# Patient Record
Sex: Female | Born: 1935 | Race: White | Hispanic: No
Health system: Midwestern US, Academic
[De-identification: ages and names within clinical notes are randomized; demographics above are authoritative.]

---

## 2017-05-28 ENCOUNTER — Encounter: Admit: 2017-05-28 | Discharge: 2017-05-28 | Payer: MEDICARE

## 2017-06-28 ENCOUNTER — Encounter: Admit: 2017-06-28 | Discharge: 2017-06-28 | Payer: MEDICARE

## 2017-06-28 NOTE — Telephone Encounter
-----   Message from Weston BrassStephen Raymonde Hamblin sent at 05/25/2017  4:52 PM CDT -----  Regarding: bp check  Patient comes in for bp check after increase in metoprolol.  She brings her bp log and her bp machine.  Pressure at home appear to be high running mostly in the upper 150's-170's/80's-90's with pulse in the 80's.  In clinic today bp machine ran higher then manuel BP with 20pt difference diastolic.    Would you like to go up on metoprolol or add losartan with bp check in 2 wks

## 2017-06-28 NOTE — Telephone Encounter
Discussed with TLR contiue current coarse

## 2017-08-10 ENCOUNTER — Ambulatory Visit: Admit: 2017-08-10 | Discharge: 2017-08-11 | Payer: MEDICARE

## 2017-08-10 ENCOUNTER — Encounter: Admit: 2017-08-10 | Discharge: 2017-08-10 | Payer: MEDICARE

## 2017-08-10 DIAGNOSIS — E785 Hyperlipidemia, unspecified: ICD-10-CM

## 2017-08-10 DIAGNOSIS — I2510 Atherosclerotic heart disease of native coronary artery without angina pectoris: Principal | ICD-10-CM

## 2017-08-10 DIAGNOSIS — I10 Essential (primary) hypertension: ICD-10-CM

## 2017-08-10 DIAGNOSIS — E119 Type 2 diabetes mellitus without complications: ICD-10-CM

## 2017-08-10 DIAGNOSIS — I779 Disorder of arteries and arterioles, unspecified: ICD-10-CM

## 2017-08-10 DIAGNOSIS — Z951 Presence of aortocoronary bypass graft: ICD-10-CM

## 2017-08-10 DIAGNOSIS — I739 Peripheral vascular disease, unspecified: ICD-10-CM

## 2017-09-28 ENCOUNTER — Encounter: Admit: 2017-09-28 | Discharge: 2017-09-28 | Payer: MEDICARE

## 2017-09-28 ENCOUNTER — Ambulatory Visit: Admit: 2017-09-28 | Discharge: 2017-09-29 | Payer: MEDICARE

## 2017-09-28 DIAGNOSIS — E785 Hyperlipidemia, unspecified: ICD-10-CM

## 2017-09-28 DIAGNOSIS — I10 Essential (primary) hypertension: ICD-10-CM

## 2017-09-28 DIAGNOSIS — R0600 Dyspnea, unspecified: ICD-10-CM

## 2017-09-28 DIAGNOSIS — R000 Tachycardia, unspecified: ICD-10-CM

## 2017-09-28 DIAGNOSIS — I739 Peripheral vascular disease, unspecified: ICD-10-CM

## 2017-09-28 DIAGNOSIS — E782 Mixed hyperlipidemia: ICD-10-CM

## 2017-09-28 DIAGNOSIS — Z951 Presence of aortocoronary bypass graft: ICD-10-CM

## 2017-09-28 DIAGNOSIS — I2510 Atherosclerotic heart disease of native coronary artery without angina pectoris: Principal | ICD-10-CM

## 2017-09-28 DIAGNOSIS — E119 Type 2 diabetes mellitus without complications: ICD-10-CM

## 2017-09-28 DIAGNOSIS — I779 Disorder of arteries and arterioles, unspecified: ICD-10-CM

## 2017-09-28 NOTE — Telephone Encounter
Patient called today c/o increasing dyspnea over the past 2wks.  She denies fever.  She states occasional.  She requested work in for evaluation in clinic today.  Reports BP 156/82 P107.

## 2017-09-29 ENCOUNTER — Inpatient Hospital Stay
Admit: 2017-09-29 | Discharge: 2017-10-04 | Disposition: A | Discharge status: Home / Self Care | Payer: MEDICARE | Source: Other Acute Inpatient Hospital | Attending: Cardiovascular Disease | Admitting: Cardiovascular Disease

## 2017-09-29 ENCOUNTER — Encounter: Admit: 2017-09-29 | Discharge: 2017-09-29 | Payer: MEDICARE

## 2017-09-29 DIAGNOSIS — R69 Illness, unspecified: Principal | ICD-10-CM

## 2017-09-29 LAB — CBC AND DIFF
Lab: 0.1 10*3/uL (ref 0–0.20)
Lab: 0.1 10*3/uL (ref 0–0.45)
Lab: 0.8 10*3/uL (ref 0–0.80)
Lab: 1 % (ref 0–2)
Lab: 1 % (ref 0–5)
Lab: 1.9 10*3/uL (ref 1.0–4.8)
Lab: 12 10*3/uL — ABNORMAL HIGH (ref 4.5–11.0)
Lab: 6 % (ref 4–12)
Lab: 9.2 10*3/uL — ABNORMAL HIGH (ref 1.8–7.0)

## 2017-09-29 LAB — BASIC METABOLIC PANEL
Lab: 0.8 mg/dL (ref 0.4–1.00)
Lab: 134 MMOL/L — ABNORMAL LOW (ref 137–147)
Lab: 15 mg/dL — ABNORMAL HIGH (ref 7–25)
Lab: 167 mg/dL — ABNORMAL HIGH (ref 70–100)
Lab: 9.9 mg/dL (ref 8.5–10.6)
Lab: >60 mL/min (ref >60)
Lab: >60 mL/min — ABNORMAL LOW (ref >60)

## 2017-09-29 LAB — LACTIC ACID(LACTATE)
Lab: 2.6 MMOL/L — ABNORMAL HIGH (ref >60)
Lab: 1.2 MMOL/L (ref 0.5–2.0)

## 2017-09-29 LAB — BNP (B-TYPE NATRIURETIC PEPTI): Lab: 111 pg/mL — ABNORMAL HIGH (ref >60)

## 2017-09-29 LAB — LIVER FUNCTION PANEL
Lab: 0.1 mg/dL (ref <0.4)
Lab: 0.3 mg/dL (ref 0.3–1.2)
Lab: 15 U/L (ref 7–56)
Lab: 17 U/L (ref 7–40)
Lab: 3.6 g/dL (ref 3.5–5.0)
Lab: 48 U/L (ref 25–110)
Lab: 6.5 g/dL (ref 6.0–8.0)

## 2017-09-29 LAB — TROPONIN-I
Lab: 0.3 ng/mL — ABNORMAL HIGH (ref 0.0–0.05)
Lab: 0.2 ng/mL — ABNORMAL HIGH (ref 0.0–0.05)

## 2017-09-29 LAB — POC GLUCOSE
Lab: 137 mg/dL — ABNORMAL HIGH (ref 70–100)
Lab: 159 mg/dL — ABNORMAL HIGH (ref 70–100)
Lab: 165 mg/dL — ABNORMAL HIGH (ref 70–100)

## 2017-09-29 LAB — MAGNESIUM: Lab: 1.5 mg/dL — ABNORMAL LOW (ref 1.6–2.6)

## 2017-09-29 LAB — PTT (APTT): Lab: 29 s (ref 20.0–36.0)

## 2017-09-29 MED ORDER — PERFLUTREN LIPID MICROSPHERES 1.1 MG/ML IV SUSP
1-20 mL | Freq: Once | INTRAVENOUS | 0 refills | Status: CP
Start: 2017-09-29 — End: ?
  Administered 2017-09-29: 16:00:00 2 mL via INTRAVENOUS

## 2017-09-29 MED ORDER — ASPIRIN 81 MG PO TBEC
81 mg | Freq: Every day | ORAL | 0 refills | Status: DC
Start: 2017-09-29 — End: 2017-10-04
  Administered 2017-09-29 – 2017-10-04 (×6): 81 mg via ORAL

## 2017-09-29 MED ORDER — HEPARIN (PORCINE) BOLUS FOR CONTINUOUS INF (BAG)
20-40 [IU]/kg | INTRAVENOUS | 0 refills | Status: DC
Start: 2017-09-29 — End: 2017-10-02

## 2017-09-29 MED ORDER — METHYLPREDNISOLONE 16 MG PO TAB
32 mg | Freq: Once | ORAL | 0 refills | Status: CP | PRN
Start: 2017-09-29 — End: ?
  Administered 2017-09-29: 23:00:00 32 mg via ORAL

## 2017-09-29 MED ORDER — HEPARIN (PORCINE) BOLUS FOR CONTINUOUS INF (BAG)
80 [IU]/kg | Freq: Once | INTRAVENOUS | 0 refills | Status: CP
Start: 2017-09-29 — End: ?

## 2017-09-29 MED ORDER — INSULIN ASPART 100 UNIT/ML SC FLEXPEN
0-7 [IU] | Freq: Three times a day (TID) | SUBCUTANEOUS | 0 refills | Status: DC
Start: 2017-09-29 — End: 2017-09-30
  Administered 2017-09-29: 18:00:00 1 [IU] via SUBCUTANEOUS

## 2017-09-29 MED ORDER — AMLODIPINE 10 MG PO TAB
10 mg | Freq: Every day | ORAL | 0 refills | Status: DC
Start: 2017-09-29 — End: 2017-10-04
  Administered 2017-09-29 – 2017-10-04 (×6): 10 mg via ORAL

## 2017-09-29 MED ORDER — HYDRALAZINE 20 MG/ML IJ SOLN
10 mg | Freq: Once | INTRAVENOUS | 0 refills | Status: CP
Start: 2017-09-29 — End: ?
  Administered 2017-09-29: 16:00:00 10 mg via INTRAVENOUS

## 2017-09-29 MED ORDER — ROSUVASTATIN 20 MG PO TAB
20 mg | Freq: Every day | ORAL | 0 refills | Status: DC
Start: 2017-09-29 — End: 2017-10-04
  Administered 2017-09-29 – 2017-10-04 (×6): 20 mg via ORAL

## 2017-09-29 MED ORDER — LEVOTHYROXINE 100 MCG PO TAB
100 ug | Freq: Every day | ORAL | 0 refills | Status: DC
Start: 2017-09-29 — End: 2017-10-04
  Administered 2017-09-29 – 2017-10-04 (×8): 100 ug via ORAL

## 2017-09-29 MED ORDER — INSULIN ASPART 100 UNIT/ML SC FLEXPEN
0-7 [IU] | Freq: Before meals | SUBCUTANEOUS | 0 refills | Status: DC
Start: 2017-09-29 — End: 2017-10-02

## 2017-09-29 MED ORDER — HEPARIN (PORCINE) IN 5 % DEX 20,000 UNIT/500 ML (40 UNIT/ML) IV SOLP
0-2000 [IU]/h | INTRAVENOUS | 0 refills | Status: DC
Start: 2017-09-29 — End: 2017-10-01
  Administered 2017-09-29: 18:00:00 1445 [IU]/h via INTRAVENOUS
  Administered 2017-09-30 (×2): 1245 [IU]/h via INTRAVENOUS

## 2017-09-29 MED ORDER — NITROGLYCERIN 0.4 MG SL SUBL
0.4 mg | SUBLINGUAL | 0 refills | Status: DC | PRN
Start: 2017-09-29 — End: 2017-10-04

## 2017-09-29 MED ORDER — METOPROLOL TARTRATE 25 MG PO TAB
37.5 mg | Freq: Two times a day (BID) | ORAL | 0 refills | Status: DC
Start: 2017-09-29 — End: 2017-10-03
  Administered 2017-09-30 – 2017-10-03 (×8): 37.5 mg via ORAL

## 2017-09-29 MED ORDER — METHYLPREDNISOLONE 16 MG PO TAB
32 mg | Freq: Once | ORAL | 0 refills | Status: CP | PRN
Start: 2017-09-29 — End: ?
  Administered 2017-09-30: 09:00:00 32 mg via ORAL

## 2017-09-29 MED ORDER — DIPHENHYDRAMINE HCL 50 MG PO CAP
50 mg | Freq: Once | ORAL | 0 refills | Status: CP | PRN
Start: 2017-09-29 — End: ?
  Administered 2017-09-30: 10:00:00 50 mg via ORAL

## 2017-09-29 MED ORDER — ESCITALOPRAM OXALATE 5 MG PO TAB
5 mg | Freq: Every day | ORAL | 0 refills | Status: DC
Start: 2017-09-29 — End: 2017-10-04
  Administered 2017-09-29 – 2017-10-04 (×6): 5 mg via ORAL

## 2017-09-29 MED ORDER — SENNOSIDES-DOCUSATE SODIUM 8.6-50 MG PO TAB
2 | Freq: Two times a day (BID) | ORAL | 0 refills | Status: DC
Start: 2017-09-29 — End: 2017-10-04
  Administered 2017-09-29: 18:00:00 2 via ORAL
  Administered 2017-10-01: 02:00:00 1 via ORAL
  Administered 2017-10-02: 03:00:00 2 via ORAL
  Administered 2017-10-02 – 2017-10-03 (×2): 1 via ORAL
  Administered 2017-10-03: 01:00:00 2 via ORAL
  Administered 2017-10-04 (×2): 1 via ORAL

## 2017-09-29 MED ORDER — LORATADINE 10 MG PO TAB
10 mg | Freq: Every morning | ORAL | 0 refills | Status: DC
Start: 2017-09-29 — End: 2017-10-04
  Administered 2017-09-29 – 2017-10-04 (×6): 10 mg via ORAL

## 2017-09-29 MED ORDER — POTASSIUM CHLORIDE 20 MEQ PO TBTQ
60 meq | Freq: Once | ORAL | 0 refills | Status: CP
Start: 2017-09-29 — End: ?
  Administered 2017-09-29: 20:00:00 60 meq via ORAL

## 2017-09-29 MED ORDER — ACETAMINOPHEN 325 MG PO TAB
650 mg | ORAL | 0 refills | Status: DC | PRN
Start: 2017-09-29 — End: 2017-10-04

## 2017-09-29 MED ORDER — MAGNESIUM SULFATE IN D5W 1 GRAM/100 ML IV PGBK
1 g | INTRAVENOUS | 0 refills | Status: CP
Start: 2017-09-29 — End: ?
  Administered 2017-09-29 (×4): 1 g via INTRAVENOUS

## 2017-09-29 MED ORDER — ONDANSETRON HCL 4 MG PO TAB
4-8 mg | ORAL | 0 refills | Status: DC | PRN
Start: 2017-09-29 — End: 2017-10-02

## 2017-09-29 NOTE — Progress Notes
RT Adult Assessment Note    NAME:Deem ALORIA LOOPER             MRN: [id number]             DOB:[birth year]          AGE: 81 y.o.  ADMISSION DATE: 09/29/2017             DAYS ADMITTED: LOS: 0 days    RT Treatment Plan:       Protocol Plan: Procedures  Oxygen/Humidity: O2 to keep SpO2 > 92%  Monitoring: Pulse oximetry BID & PRN    Additional Comments:  Impressions of the patient: Patient laying in bed, alert, orientated on 4 lpm oxygen  Intervention(s)/outcome(s): monitor oxygen BID  Patient education that was completed: none  Recommendations to the care team: none    Vital Signs:  Pulse: Pulse: 99  RR: Respirations: (!) 27 PER MINUTE  SpO2: SpO2: 99 %  O2 Device: $$ O2 Device: Cannula  Liter Flow: O2 Liter Flow: 4 lpm  O2%:    Breath Sounds:    Respiratory Effort:

## 2017-09-29 NOTE — Consults
Katrina Wang  Admission Date: 09/29/2017  LOS: 0 days                     ASSESSMENT/PLAN     ATTESTATION    I have seen, personally fully evaluated, and discussed patient with the [facility] ICU team.  The patient is critically ill with Syncope. I spent 61 minutes (excluding time spent performing or supervising any procedures) providing and personally directing critical care services including direct OR recovery, ventilator management, hemodynamic monitoring and management, lab and radiology review, medication review and management, fluid and electrolyte management and coordination of care.    Staff name:  Cathlean Marseilles, MD Date:  09/29/2017  Time: 5:20 PM       Patient Active Problem List    Diagnosis Date Noted   ??? Syncope 09/29/2017   ??? Atherosclerosis of native artery of extremity with intermittent claudication (HCC) 09/29/2017   ??? Occlusion of carotid artery 09/29/2017   ??? Depression, controlled 09/29/2017   ??? 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 Sciatica of left side 09/29/2017   ??? Renal artery stenosis (HCC) 09/29/2017   ??? Dyspnea on exertion 09/28/2017   ??? Sinus tachycardia 09/28/2017   ??? Dyspnea 09/28/2017   ??? S/P CABG (coronary artery bypass graft) 11/25/2016   ??? Post-operative nausea and vomiting 10/23/2016   ??? Type 2 diabetes mellitus (HCC)    ??? Chronic diastolic CHF (congestive heart failure) (HCC) 10/16/2016   ??? H/O vein stripping 10/15/2016   ??? Hypertensive emergency 08/26/2016   ??? Near syncope 08/26/2016   ??? AKI (acute kidney injury) (HCC) 03/07/2012   ??? NSTEMI (non-ST elevated myocardial infarction) (HCC) 03/05/2012   ??? Acute respiratory failure with hypoxia (HCC) 03/05/2012   ??? CAD (coronary artery disease) 07/04/2008     1999: Complex stent PCI to proximal OMB    03/07/12: EF 60%, 20% ostial left main, 40% prox LAD, 70% mid, 40% distal LAD at previous POBA site; Dominant CFX - 20% prox-CFX, 40% mid-CFX, 30% prox 1st OMB,  50% instent restenosis in 2nd OMB, 40-50% distal 2nd OMB, 40-50% left PDA, small diffusely disease RCA without significant obstruction, per cath by Dr. Vivianne Spence.  No PCI indicated.     ??? Abnormal cardiovascular stress test 07/04/2008   ??? PVD (peripheral vascular disease) (HCC) 07/04/2008     Hx bilateral iliac stents     ??? Carotid artery disease (HCC) 07/04/2008     Hx left carotid endarterectomy     ??? HLD (hyperlipidemia) 07/04/2008   ??? HTN (hypertension) 07/04/2008        Neuro: Syncope, Depression, Chronic Pain  PRN pain medication. Concern for PE as etiology for syncope, workup in progress, TTE supports this. Assess daily for delirium. Continue home Lexapro. Monitor fos s/sx CVA with concern for PE.    Cardiac: Concern for PE with syncope and dyspnea, HTN, HLD, hx NSTEMI, CAD s/p PCI (OMB stent 1999) s/p CABG x 2 09/2016, Carotid artery stenosis s/p L CEA, severe PVD s/p b/l iliofemoral bypass, chronic HFpEF (60%)  Stronly suspect PE with evidence of RV strain and elevated PAP on TTE. Continue home ASA, metoprolol, statin. Heparin gtt for PE. Pt has contrast allergy and premedicating for CTA Chest. Pretreat with benadryl, methylpred. Troponins with modest peak at 0.30 --> 0.26, BNP 1100, LA 2.6 --> 1.2. Continue to trend. EKG with no acute S1Q3T3 concerns, or changes. BLE venous dopplers pending.  Respiratory:  Concern for PE, progressive dyspnea, acute hypoxic respiratory failure, dyspnea on exertion  Tolerating 4L O2 via NAC since admission for SpO2 > 92%. May need to send to CTA sooner if oxygen demands increase.  GI: No acute concerns  Continue bowel regimen, anti-emetics. NPO for procedures as needed. Cardiac diet. Check LFTs.  Heme: Concern for PE, need for anticoagulation/antiplatelet  Heparin gtt started. Continue home ASA at lower dose. No significant derangements in hematopoetic cell lines.   ID: Hx uterine ca s/p hysterectomy, leukocytosis Mild leukocytosis, no left shift. Likely reactive. Concern for etiology of possible PE with cancer hx. Pt also has significant activity restrictions 2/2 comorbidities.  Renal: Renal artery stenosis, DM2, hx AKI      Pt with chronic severe hypertension. MRI 07/2016 showed bilateral renal stenosis (moderate on right and moderate to severe on left). Also concerns for stenosis of major aortic trunks - difficult to visualize. May discuss with CV1 and add renal artery duplex +/- aortic runoff to CTA. Cr at baseline (0.87)  FEN: Hypomagnesemia, DM2  Electrolyte goals while in ICU:  Mg >2.0, iCal > 1.0, K+ >4.0 mEq/L.  Insulin per Modified Yale Protocol as needed. Check TSH - elevated in 10/2016. Continue home levothyroxine.  Activity: As tolerated    Dispo:  Requires ICU for treatment of possible PE, evaluate syncope, mild respiratory insuffiency    Lines:  PIV x 2  Urinary Catheter:  No  ABX: no recent antibiotics  DVT PPx: Heparin  GI PPx: none needed - tolerating cardiac diet PO    __________________________________________________________________________________  HISTORY     HPI  35F for transfer from OSH with hx of NSTEMI, CAD s/p PCI and CABG 09/2016, DM2, PVD s/p bilateral ilio-fem bypass, HTN, HLD, hypothyroidism, carotid disease and uterine cancer. She presented to OSH ED after syncopal episode and c/o 1 month progressive dyspnea. TTE 10/3 showed new RV dilation and decreased RVSF, and severely elevated PAP, with preserved LVEF. Heparin was started upon arrival, and CTA chest will be performed after premedication 2/2 contrast allergy with strong suspicion of PE.    Past Medical History  Past Medical History:   Diagnosis Date   ??? CAD (coronary artery disease) 07/04/2008   ??? Carotid artery disease (HCC) 07/04/2008   ??? H/O two vessel coronary artery bypass graft    ??? HLD (hyperlipidemia) 07/04/2008   ??? HTN (hypertension) 07/04/2008   ??? PVD (peripheral vascular disease) (HCC) 07/04/2008   ??? Type 2 diabetes mellitus (HCC) Past Surgical History  Past Surgical History:   Procedure Laterality Date   ??? HX CORONARY STENT PLACEMENT  1999   ??? TOTAL ABDOMINAL HYSTERECTOMY  08/2009   ??? PERCUTANEOUS CORONARY INTERVENTION N/A 10/15/2016    Possible Percutaneous Coronary Intervention performed by Nat Math, MD at [facility]   ??? CORONARY ARTERY BYPASS GRAFT N/A 10/16/2016    BYPASS GRAFT CORONARY ARTERY; LEFT INTERNAL MAMMARY ARTERY HARVEST; RIGHT ENDOSCOPIC SAPHENOUS VEIN HARVEST  performed by Collene Schlichter, MD at [facility]   ??? CAROTID ENDARDECTOMY      LEFT   ??? ILIO-FEMORAL BYPASS GRAFT      BILATERAL STENTS       Allergies  Allergies   Allergen Reactions   ??? Imdur [Isosorbide Mononitrate] HEADACHE   ??? Iodinated Contrast- Oral And Iv Dye RASH   ??? Lisinopril COUGH   ??? Atorvastatin JOINT PAIN        Home Medications  Prior to Admission Meds for Inpatient   Prescriptions Prior to  Admission   Medication Sig   ??? acetaminophen (TYLENOL) 325 mg tablet Take 2 tablets by mouth every 6 hours as needed.   ??? amLODIPine (NORVASC) 10 mg tablet Take 10 mg by mouth daily.   ??? aspirin EC 325 mg tablet Take 325 mg by mouth daily. Take with food.   ??? CHOLECALCIFEROL (VITAMIN D3) (VITAMIN D3 PO) Take 1 tablet by mouth daily.   ??? coenzyme Q10(+) 100 mg cap Take 100 mg by mouth.   ??? cranberry conc-ascorbic acid 12,600-20 mg cap Take  by mouth.   ??? CYANOCOBALAMIN (VITAMIN B-12) (VITAMIN B12 PO) Take 1 tablet by mouth daily.   ??? escitalopram oxalate (LEXAPRO) 5 mg tablet Take 5 mg by mouth daily.   ??? glimepiride (AMARYL) 1 mg tablet Take 1 mg by mouth twice daily.   ??? loratadine (CLARITIN) 10 mg tablet Take 10 mg by mouth every morning.   ??? metFORMIN (GLUCOPHAGE) 1,000 mg tablet Take 1 tablet by mouth twice daily with meals.   ??? metoprolol tartrate (LOPRESSOR) 37.5 mg tablet Take 1 tablet by mouth twice daily.   ??? nitroglycerin (NITROSTAT) 0.4 mg tablet 1 Tab every 5 minutes as needed for Chest Pain (Call Dr. if pain does not resolve after 2 consecutive uses). ??? ondansetron (ZOFRAN) 4 mg tablet Take 1-2 tablets by mouth every 6 hours as needed.   ??? rosuvastatin (CRESTOR) 20 mg tablet Take 1 tablet by mouth daily.   ??? senna/docusate (SENOKOT-S) 8.6/50 mg tablet Take 2 tablets by mouth twice daily.   ??? SYNTHROID 100 mcg tablet Take 1 tablet by mouth daily.       Social History  Social History   Substance Use Topics   ??? Smoking status: Former Smoker     Packs/day: 1.00     Years: 20.00     Types: Cigarettes     Quit date: 12/28/1988   ??? Smokeless tobacco: Never Used      Comment: Pt quit 20 years ago.   ??? Alcohol use No        Family History  Family History   Problem Relation Age of Onset   ??? Coronary Artery Disease Mother    ??? Cancer Father        Review of Systems  Constitutional: positive for fatigue  Eyes: negative  Ears, nose, mouth, throat, and face: negative  Respiratory: positive for increased work of breathing or dyspnea on exertion  Cardiovascular: positive for dyspnea, syncope, fatigue  Gastrointestinal: negative  Genitourinary:negative  Integument/breast: negative  Hematologic/lymphatic: negative  Musculoskeletal:negative  Neurological: positive for headaches  Behavioral/Psych: negative  Endocrine: positive for Diabetes mellitus Type II  on oral hypoglycemics  Allergic/Immunologic: positive for contrast allergy    OBJECTIVE                       Vital Signs: Last Filed                  Vital Signs: 24 Hour Range   BP: 130/88 (10/03 1700)  Temp: 36.5 ???C (97.7 ???F) (10/03 1600)  Pulse: 97 (10/03 1700)  Respirations: 22 PER MINUTE (10/03 1700)  SpO2: 98 % (10/03 1700)  O2 Delivery: Nasal Cannula (10/03 1700)  Height: 165.1 cm (65) (10/03 1126)  Weight: 80.3 kg (177 lb) (10/03 1126)  BP: (106-171)/(61-100)   Temp:  [36.4 ???C (97.6 ???F)-36.5 ???C (97.7 ???F)]   Pulse:  [90-105]   Respirations:  [14 PER MINUTE-27 PER MINUTE]   SpO2:  [  94 %-99 %]   O2 Delivery: Nasal Cannula    Intensity Pain Scale (Self Report): (not recorded) Vitals:    09/29/17 1126 Weight: 80.3 kg (177 lb)           Artificial airway:  None              Ventilator/ Respiratory Therapy:  No     Physical Exam:    General: no apparent distress and alert & oriented x4  Neurologic:  AOx3. Gait normal. Cranial nerves 2-12 and sensation grossly intact.  HEENT:   NCAT w/o lesions or tenderness   conjunctivae/corneas clear. PERRL, EOM's intact.   supple, symmetrical, trachea midline, no adenopathy, thyroid: not enlarged, symmetric, no tenderness/mass/nodules,  no carotid bruit and no JVD  Heart: regular rate and rhythm, S1, S2 normal, no murmur, click, rub or gallop  -  Lungs: Normal chest wall and respirations. Clear to auscultation.              Abdomen: soft, non-tender. Bowel sounds normal. No masses,  no organomegaly  MSK: negative  Integumentary: no rashes, no ecchymoses, no petechiae, no purpura  Extremities: extremities normal, trace edema    Drains: none  Devices: -, Not applicable  Restraints: none    Lab Review:    24-hour labs:    Results for orders placed or performed during the hospital encounter of 09/29/17 (from the past 24 hour(s))   POC GLUCOSE    Collection Time: 09/29/17  9:37 AM   Result Value Ref Range    Glucose, POC 137 (H) 70 - 100 MG/DL   CBC AND DIFF    Collection Time: 09/29/17 10:21 AM   Result Value Ref Range    White Blood Cells 12.2 (H) 4.5 - 11.0 K/UL    RBC 4.72 4.0 - 5.0 M/UL    Hemoglobin 13.3 12.0 - 15.0 GM/DL    Hematocrit 16.1 36 - 45 %    MCV 85.3 80 - 100 FL    MCH 28.2 26 - 34 PG    MCHC 33.0 32.0 - 36.0 G/DL    RDW 09.6 (H) 11 - 15 %    Platelet Count 333 150 - 400 K/UL    MPV 8.9 7 - 11 FL    Neutrophils 76 41 - 77 %    Lymphocytes 16 (L) 24 - 44 %    Monocytes 6 4 - 12 %    Eosinophils 1 0 - 5 %    Basophils 1 0 - 2 %    Absolute Neutrophil Count 9.20 (H) 1.8 - 7.0 K/UL    Absolute Lymph Count 1.90 1.0 - 4.8 K/UL    Absolute Monocyte Count 0.80 0 - 0.80 K/UL    Absolute Eosinophil Count 0.10 0 - 0.45 K/UL    Absolute Basophil Count 0.10 0 - 0.20 K/UL BASIC METABOLIC PANEL    Collection Time: 09/29/17 10:21 AM   Result Value Ref Range    Sodium 134 (L) 137 - 147 MMOL/L    Potassium 3.6 3.5 - 5.1 MMOL/L    Chloride 100 98 - 110 MMOL/L    CO2 22 21 - 30 MMOL/L    Anion Gap 12 3 - 12    Glucose 167 (H) 70 - 100 MG/DL    Blood Urea Nitrogen 15 7 - 25 MG/DL    Creatinine 0.45 0.4 - 1.00 MG/DL    Calcium 9.9 8.5 - 40.9 MG/DL    eGFR Non African American >60 >60  mL/min    eGFR African American >60 >60 mL/min   MAGNESIUM    Collection Time: 09/29/17 10:21 AM   Result Value Ref Range    Magnesium 1.5 (L) 1.6 - 2.6 mg/dL   LACTIC ACID(LACTATE)    Collection Time: 09/29/17 10:21 AM   Result Value Ref Range    Lactic Acid 2.6 (H) 0.5 - 2.0 MMOL/L   BNP (B-TYPE NATRIURETIC PEPTI)    Collection Time: 09/29/17 10:21 AM   Result Value Ref Range    B Type Natriuretic Peptide 1,113.0 (H) 0 - 100 PG/ML   TROPONIN-I    Collection Time: 09/29/17 10:21 AM   Result Value Ref Range    Troponin-I 0.30 (H) 0.0 - 0.05 NG/ML   NOTES    Collection Time: 09/29/17 10:21 AM   Result Value Ref Range    Specimen Notes       EXTRA BLUE TOP THAT WAS SENT DOWN BROKE IN BAG SO NO EXTRA BLUE TOP WAS RECEIVED   PTT (APTT)    Collection Time: 09/29/17 11:58 AM   Result Value Ref Range    APTT 29.4 20.0 - 36.0 SEC   POC GLUCOSE    Collection Time: 09/29/17 12:22 PM   Result Value Ref Range    Glucose, POC 159 (H) 70 - 100 MG/DL   LACTIC ACID(LACTATE)    Collection Time: 09/29/17  3:27 PM   Result Value Ref Range    Lactic Acid 1.2 0.5 - 2.0 MMOL/L   TROPONIN-I    Collection Time: 09/29/17  3:27 PM   Result Value Ref Range    Troponin-I 0.26 (H) 0.0 - 0.05 NG/ML   POC GLUCOSE    Collection Time: 09/29/17  5:02 PM   Result Value Ref Range    Glucose, POC 165 (H) 70 - 100 MG/DL   , Hematology:    Lab Results   Component Value Date    HGB 13.3 09/29/2017    HCT 40.3 09/29/2017    PLTCT 333 09/29/2017    WBC 12.2 09/29/2017    NEUT 76 09/29/2017    ANC 9.20 09/29/2017    ALC 1.90 09/29/2017 MONA 6 09/29/2017    AMC 0.80 09/29/2017    ABC 0.10 09/29/2017    MCV 85.3 09/29/2017    MCHC 33.0 09/29/2017    MPV 8.9 09/29/2017    RDW 15.8 09/29/2017   , Coagulation:    Lab Results   Component Value Date    PTT 29.4 09/29/2017    INR 1.2 10/16/2016   , General Chemistry:    Lab Results   Component Value Date    NA 134 09/29/2017    K 3.6 09/29/2017    CL 100 09/29/2017    GAP 12 09/29/2017    BUN 15 09/29/2017    CR 0.87 09/29/2017    GLU 167 09/29/2017    CA 9.9 09/29/2017    ALBUMIN 3.8 05/11/2017    LACTIC 1.2 09/29/2017    OBSCA 1.22 10/17/2016    MG 1.5 09/29/2017    TOTBILI 0.30 05/11/2017   , Enzymes:    Lab Results   Component Value Date    AST 17 05/11/2017    ALT 12 05/11/2017    ALKPHOS 48 05/11/2017   , Cardiac markers:    Lab Results   Component Value Date    TNI 0.26 09/29/2017   , Mg and PO4:   Lab Results   Component Value Date    MG  1.5 09/29/2017   , Arterial BG :   Lab Results   Component Value Date    PHART 7.31 10/16/2016    PCO2A 41 10/16/2016    PO2ART 78 10/16/2016    HCO3A 19.8 10/16/2016    BASEDEFA 5.5 10/16/2016    O2SATACAL 94.0 10/16/2016     , Endocrine:   Lab Results   Component Value Date    TSH 5.13 11/02/2016   , HgbA1C:   Lab Results   Component Value Date    HGBA1C 7.1 11/02/2016    and Lipid Profile:   Lab Results   Component Value Date    CHOL 171 10/15/2016    TRIG 98 10/15/2016    HDL 49 10/15/2016    LDL 92 10/15/2016    VLDL 20 10/15/2016       Point of Care Testing:  (Last 24 hours):  Glucose: (!) 167 (09/29/17 1021)  POC Glucose (Download): (!) 165 (09/29/17 1702)      Radiology and Other Diagnostic Procedures Review:      Chest x-ray, CT of Abdomen, MRI of abdomen and TTE  Duplex - Renal arteries pending    Charlies Silvers, MD, MS, CCC/SLP 09/29/2017 5:20 PM  Clinical Instructor, Anesthesiology and Critical Care  [phone]

## 2017-09-29 NOTE — Progress Notes
Patient arrived on unit via bed accompanied by EMS. Patient transferred to the bed without assistance. Assessment completed, refer to flowsheet for details. Orders released, reviewed, and implemented as appropriate. Oriented to surroundings, call light within reach. Plan of care reviewed.  Will continue to monitor and assess.

## 2017-09-29 NOTE — Progress Notes
Patient arrived to room # [id number]*) via bed accompanied by EMS. Patient transferred to the bed without assistance. Bedside safety checks completed. Initial patient assessment completed, refer to flowsheet for details. Admission skin assessment completed by: Clifton Custard, RN and Tacey Ruiz, RN    Pressure Injury Present on Hospital Admission (within 24 hours): No    1. Occiput: No  2. Ear: No  3. Scapula: No  4. Spinous Process: No  5. Shoulder: No  6. Elbow: No  7. Iliac Crest: No  8. Sacrum/Coccyx: No  9. Ischial Tuberosity: No  10. Trochanter: No  11. Knee: No  12. Malleolus: No  13. Heel: No  14. Toes: No  15. Assessed for device associated injury Yes  16. Nursing Nutrition Assessment Completed Yes    See Doc Flowsheet for additional wound details.     INTERVENTIONS: Will continue to monitor.

## 2017-09-30 ENCOUNTER — Encounter: Admit: 2017-09-30 | Discharge: 2017-09-30 | Payer: MEDICARE

## 2017-09-30 ENCOUNTER — Inpatient Hospital Stay: Admit: 2017-09-30 | Discharge: 2017-09-30 | Payer: MEDICARE

## 2017-09-30 DIAGNOSIS — I10 Essential (primary) hypertension: Secondary | ICD-10-CM

## 2017-09-30 DIAGNOSIS — E039 Hypothyroidism, unspecified: ICD-10-CM

## 2017-09-30 DIAGNOSIS — I2510 Atherosclerotic heart disease of native coronary artery without angina pectoris: ICD-10-CM

## 2017-09-30 DIAGNOSIS — I739 Peripheral vascular disease, unspecified: ICD-10-CM

## 2017-09-30 DIAGNOSIS — E119 Type 2 diabetes mellitus without complications: ICD-10-CM

## 2017-09-30 DIAGNOSIS — E785 Hyperlipidemia, unspecified: ICD-10-CM

## 2017-09-30 DIAGNOSIS — Z951 Presence of aortocoronary bypass graft: ICD-10-CM

## 2017-09-30 DIAGNOSIS — I779 Disorder of arteries and arterioles, unspecified: ICD-10-CM

## 2017-09-30 LAB — PTT (APTT)
Lab: 137 s — ABNORMAL HIGH (ref 20.0–36.0)
Lab: 85 s — ABNORMAL HIGH (ref 20.0–36.0)
Lab: 80 s — ABNORMAL HIGH (ref 20.0–36.0)

## 2017-09-30 LAB — POC GLUCOSE
Lab: 210 mg/dL — ABNORMAL HIGH (ref 70–100)
Lab: 298 mg/dL — ABNORMAL HIGH (ref 70–100)
Lab: 310 mg/dL — ABNORMAL HIGH (ref 70–100)

## 2017-09-30 LAB — CBC: Lab: 9.2 K/UL (ref >60)

## 2017-09-30 LAB — TROPONIN-I
Lab: 0.1 ng/mL — ABNORMAL HIGH (ref 0.0–0.05)
Lab: 0.1 ng/mL — ABNORMAL HIGH (ref >60)

## 2017-09-30 LAB — HEMOGLOBIN A1C: Lab: 7.9 % — ABNORMAL HIGH (ref 4.0–6.0)

## 2017-09-30 LAB — THYROID STIMULATING HORMONE-TSH: Lab: 0.8 uU/mL (ref 0.35–5.00)

## 2017-09-30 LAB — BASIC METABOLIC PANEL: Lab: 133 MMOL/L — ABNORMAL LOW (ref 137–147)

## 2017-09-30 LAB — MAGNESIUM: Lab: 2.5 mg/dL — ABNORMAL LOW (ref >60)

## 2017-09-30 LAB — BNP (B-TYPE NATRIURETIC PEPTI): Lab: 129 pg/mL — ABNORMAL HIGH (ref 0–100)

## 2017-09-30 MED ORDER — ALTEPLASE INFUSION (CATH CLEARANCE) 5MG/50ML
1 mg/h | 0 refills | Status: DC
Start: 2017-09-30 — End: 2017-09-30

## 2017-09-30 MED ORDER — MIDAZOLAM 1 MG/ML IJ SOLN
0 refills | Status: CP
Start: 2017-09-30 — End: ?
  Administered 2017-09-30: 22:00:00 0.5 mg via INTRAVENOUS
  Administered 2017-09-30 (×2): 1 mg via INTRAVENOUS

## 2017-09-30 MED ORDER — SODIUM CHLORIDE 0.9 % IJ SOLN
50 mL | Freq: Once | INTRAVENOUS | 0 refills | Status: CP
Start: 2017-09-30 — End: ?
  Administered 2017-09-30: 12:00:00 50 mL via INTRAVENOUS

## 2017-09-30 MED ORDER — DIPHENHYDRAMINE HCL 50 MG/ML IJ SOLN
50 mg | Freq: Once | INTRAVENOUS | 0 refills | Status: CP
Start: 2017-09-30 — End: ?
  Administered 2017-09-30: 21:00:00 50 mg via INTRAVENOUS

## 2017-09-30 MED ORDER — FENTANYL CITRATE (PF) 50 MCG/ML IJ SOLN
50 ug | Freq: Once | INTRAVENOUS | 0 refills | Status: CP
Start: 2017-09-30 — End: ?
  Administered 2017-09-30: 21:00:00 50 ug via INTRAVENOUS

## 2017-09-30 MED ORDER — HEPARIN (PORCINE) IN 5 % DEX 20,000 UNIT/500 ML (40 UNIT/ML) IV SOLP
500 [IU]/h | 0 refills | Status: DC
Start: 2017-09-30 — End: 2017-10-02
  Administered 2017-09-30: 500 [IU]/h

## 2017-09-30 MED ORDER — ALTEPLASE 50 MG IV SOLR
0 refills | Status: CP
Start: 2017-09-30 — End: ?
  Administered 2017-09-30: 22:00:00 8 mg via INTRA_ARTERIAL

## 2017-09-30 MED ORDER — ALTEPLASE INFUSION (CATH CLEARANCE) 10MG/500ML
.5 mg/h | 0 refills | Status: DC
Start: 2017-09-30 — End: 2017-10-02
  Administered 2017-10-01 (×2): 0.5 mg/h

## 2017-09-30 MED ORDER — GLIMEPIRIDE 1 MG PO TAB
1 mg | Freq: Two times a day (BID) | ORAL | 0 refills | Status: DC
Start: 2017-09-30 — End: 2017-09-30

## 2017-09-30 MED ORDER — IOPAMIDOL 61 % IV SOLN
130 mL | Freq: Once | INTRA_ARTERIAL | 0 refills | Status: CP
Start: 2017-09-30 — End: ?
  Administered 2017-09-30: 23:00:00 130 mL via INTRA_ARTERIAL

## 2017-09-30 MED ORDER — ALTEPLASE INFUSION (CATH CLEARANCE) 10MG/500ML
.5 mg/h | 0 refills | Status: DC
Start: 2017-09-30 — End: 2017-10-02
  Administered 2017-09-30 – 2017-10-01 (×6): 0.5 mg/h

## 2017-09-30 MED ORDER — HYDROCORTISONE SOD SUCC (PF) 100 MG/2 ML IJ SOLR
100 mg | INTRAVENOUS | 0 refills | Status: DC
Start: 2017-09-30 — End: 2017-09-30
  Administered 2017-09-30: 21:00:00 100 mg via INTRAVENOUS

## 2017-09-30 MED ORDER — FENTANYL CITRATE (PF) 50 MCG/ML IJ SOLN
0 refills | Status: CP
Start: 2017-09-30 — End: ?
  Administered 2017-09-30 (×3): 50 ug via INTRAVENOUS

## 2017-09-30 MED ORDER — BENZOCAINE-MENTHOL 6-10 MG MM LOZG
1 | ORAL | 0 refills | Status: DC | PRN
Start: 2017-09-30 — End: 2017-10-04
  Administered 2017-10-01: 04:00:00 1 via ORAL

## 2017-09-30 MED ORDER — MIDAZOLAM 1 MG/ML IJ SOLN
1-2 mg | Freq: Once | INTRAVENOUS | 0 refills | Status: CP
Start: 2017-09-30 — End: ?
  Administered 2017-09-30: 21:00:00 1 mg via INTRAVENOUS

## 2017-09-30 MED ORDER — IOPAMIDOL 76 % IV SOLN
65 mL | Freq: Once | INTRAVENOUS | 0 refills | Status: CP
Start: 2017-09-30 — End: ?
  Administered 2017-09-30: 12:00:00 65 mL via INTRAVENOUS

## 2017-09-30 NOTE — Progress Notes
1530: Patient to IR in bed by Resource RN. VSS.    1648: Patient returned to room in bed by Resource RN. VSS. See flowsheets. Patient tolerated procedure and transport well.

## 2017-09-30 NOTE — Progress Notes
Main pulmonary artery pressure is 33

## 2017-09-30 NOTE — Progress Notes
Critical Care Progress Note          Today's Date:  09/30/2017  Name:  Katrina Wang                       MRN:  [id number]   Admission Date: 09/29/2017  LOS: 1 day                     Assessment/Plan:   Principal Problem:    Acute saddle pulmonary embolism (HCC)  Active Problems:    CAD (coronary artery disease), native coronary artery    PVD (peripheral vascular disease) (HCC)    Carotid artery disease (HCC)    HLD (hyperlipidemia)    Essential hypertension    NSTEMI (non-ST elevated myocardial infarction) (HCC)    Acute respiratory failure with hypoxia (HCC)    Hypertensive emergency    Near syncope    Chronic diastolic CHF (congestive heart failure) (HCC)    Type 2 diabetes mellitus (HCC)    S/P CABG (coronary artery bypass graft)    Dyspnea on exertion    Syncope    Renal artery stenosis (HCC)      81 y.o. female with Acute saddle pulmonary embolism (HCC)    Neuro: Syncope, Depression, Chronic Pain  PRN pain medication. Concern for PE as etiology for syncope, workup in progress, TTE supports this. Assess daily for delirium. Continue home Lexapro. Monitor fos s/sx CVA with concern for PE.    Cardiac: Concern for PE with syncope and dyspnea, HTN, HLD, hx NSTEMI, CAD s/p PCI (OMB stent 1999) s/p CABG x 2 09/2016, Carotid artery stenosis s/p L CEA, severe PVD s/p b/l iliofemoral bypass, chronic HFpEF (60%)  Stronly suspect PE with evidence of RV strain and elevated PAP on TTE. Continue home ASA, metoprolol, statin. Heparin gtt for PE. Pt has contrast allergy and premedicating for CTA Chest. Pretreat with benadryl, methylpred. Troponins with modest peak at 0.30 --> 0.26, BNP 1100, LA 2.6 --> 1.2. Continue to trend. EKG with no acute S1Q3T3 concerns, or changes. BLE venous dopplers pending.  10/4: CTA with significant bilateral PA clot burden. L popliteal thrombus on ulstrasound. Renal artery duplex pending. Troponin trending down. BNP remains elevated with RV strain. Respiratory:  Concern for PE, progressive dyspnea, acute hypoxic respiratory failure, dyspnea on exertion  Tolerating 4L O2 via NAC since admission for SpO2 > 92%. May need to send to CTA sooner if oxygen demands increase.  10/4: Definite PE on CTA. Investigating source. Tolerating 4L NAC, one desat in CT  GI: No acute concerns  Continue bowel regimen, anti-emetics. NPO for procedures as needed. Cardiac diet. Check LFTs.  Heme: Concern for PE, need for anticoagulation/antiplatelet  Heparin gtt started. Continue home ASA at lower dose. No significant derangements in hematopoetic cell lines.   ID: Hx uterine ca s/p hysterectomy, leukocytosis  Mild leukocytosis, no left shift. Likely reactive. Concern for etiology of possible PE with cancer hx. Pt also has significant activity restrictions 2/2 comorbidities.  Renal: Renal artery stenosis, DM2, hx AKI      Pt with chronic severe hypertension. MRI 07/2016 showed bilateral renal stenosis (moderate on right and moderate to severe on left). Also concerns for stenosis of major aortic trunks - difficult to visualize. May discuss with CV1 and add renal artery duplex +/- aortic runoff to CTA. Cr at baseline (0.87)  10/4: f/u renal artery stenosis  FEN: Hypomagnesemia, DM2  Electrolyte goals while in ICU:  Mg >2.0, iCal >  1.0, K+ >4.0 mEq/L.  Insulin per Modified Yale Protocol as needed. Check TSH - elevated in 10/2016. Continue home levothyroxine.  Activity: Bedrest  ???  Dispo:  Requires ICU for treatment of PE, evaluate syncope, mild respiratory insuffiency  ???  Lines:  PIV x 2  Urinary Catheter:  No  ABX: no recent antibiotics  DVT PPx: Heparin  GI PPx: none needed - tolerating cardiac diet PO  _____________________________________________________________________________    Subjective:  Katrina Wang is a 81 y.o. female.      Overnight Events: No new events noted.      Objective:  Medications:  Scheduled Meds:  amLODIPine (NORVASC) tablet 10 mg 10 mg Oral QDAY aspirin EC tablet 81 mg 81 mg Oral QDAY   escitalopram oxalate (LEXAPRO) tablet 5 mg 5 mg Oral QDAY   glimepiride (AMARYL) tablet 1 mg 1 mg Oral BID before meals   heparin (porcine) BOLUS for continuous inf (bag) [phone] Units 20-40 Units/kg Intravenous As Prescribed   insulin aspart U-100 (NOVOLOG FLEXPEN) injection PEN 0-7 Units 0-7 Units Subcutaneous ACHS   levothyroxine (SYNTHROID) tablet 100 mcg 100 mcg Oral QDAY   loratadine (CLARITIN) tablet 10 mg 10 mg Oral QAM8   metoprolol tartrate (LOPRESSOR) tablet 37.5 mg 37.5 mg Oral BID   rosuvastatin (CRESTOR) tablet 20 mg 20 mg Oral QDAY   senna/docusate (SENOKOT-S) tablet 2 tablet 2 tablet Oral BID   Continuous Infusions:  ??? heparin (porcine) 20,000 units/D5W 500 mL infusion (std conc)(premade) 1,245 Units/hr (09/30/17 0741)     PRN and Respiratory Meds:acetaminophen Q6H PRN, nitroglycerin Q5 MIN PRN, ondansetron Q6H PRN                       Vital Signs: Last Filed                  Vital Signs: 24 Hour Range   BP: 137/82 (10/04 0800)  Temp: 36.5 ???C (97.7 ???F) (10/04 0800)  Pulse: 90 (10/04 0800)  Respirations: 28 PER MINUTE (10/04 0800)  SpO2: 98 % (10/04 0800)  O2 Delivery: Nasal Cannula (10/04 0900)  Height: 165.1 cm (65) (10/03 1126)  Weight: 80.2 kg (176 lb 12.8 oz) (10/04 0400)  BP: (104-171)/(51-100)   Temp:  [35.8 ???C (96.5 ???F)-36.8 ???C (98.2 ???F)]   Pulse:  [76-105]   Respirations:  [14 PER MINUTE-28 PER MINUTE]   SpO2:  [91 %-99 %]   O2 Delivery: Nasal Cannula    Intensity Pain Scale (Self Report): (not recorded) Vitals:    09/29/17 1126 09/30/17 0400   Weight: 80.3 kg (177 lb) 80.2 kg (176 lb 12.8 oz)         Critical Care Vitals:      ICP Monitoring:     PA  Catheter:     Hemodynamics/Oxycalcs:       Intake/Output Summary:  (Last 24 hours)    Intake/Output Summary (Last 24 hours) at 09/30/17 0909  Last data filed at 09/30/17 0900   Gross per 24 hour   Intake          2025.79 ml   Output              500 ml   Net          1525.79 ml Physical Exam:    General: no apparent distress and alert & oriented x4  Neurologic:  AOx3. Gait normal. Cranial nerves 2-12 and sensation grossly intact.  HEENT:   NCAT w/o lesions or tenderness  conjunctivae/corneas clear. PERRL, EOM's intact.   supple, symmetrical, trachea midline, no adenopathy, thyroid: not enlarged, symmetric, no tenderness/mass/nodules,  no carotid bruit and no JVD  Heart: regular rate and rhythm, S1, S2 normal, no murmur, click, rub or gallop  -  Lungs: Normal chest wall and respirations. Clear to auscultation.              Abdomen: soft, non-tender. Bowel sounds normal. No masses,  no organomegaly  MSK: negative  Integumentary: no rashes, no ecchymoses, no petechiae, no purpura  Extremities: extremities normal, trace edema    Lab Review:    Results for orders placed or performed during the hospital encounter of 09/29/17 (from the past 48 hour(s))   POC GLUCOSE    Collection Time: 09/29/17  9:37 AM   # # Low-High    Glucose, POC 137 (H) 70 - 100 MG/DL   CBC AND DIFF    Collection Time: 09/29/17 10:21 AM   # # Low-High    White Blood Cells 12.2 (H) 4.5 - 11.0 K/UL    RBC 4.72 4.0 - 5.0 M/UL    Hemoglobin 13.3 12.0 - 15.0 GM/DL    Hematocrit 16.1 36 - 45 %    MCV 85.3 80 - 100 FL    MCH 28.2 26 - 34 PG    MCHC 33.0 32.0 - 36.0 G/DL    RDW 09.6 (H) 11 - 15 %    Platelet Count 333 150 - 400 K/UL    MPV 8.9 7 - 11 FL    Neutrophils 76 41 - 77 %    Lymphocytes 16 (L) 24 - 44 %    Monocytes 6 4 - 12 %    Eosinophils 1 0 - 5 %    Basophils 1 0 - 2 %    Absolute Neutrophil Count 9.20 (H) 1.8 - 7.0 K/UL    Absolute Lymph Count 1.90 1.0 - 4.8 K/UL    Absolute Monocyte Count 0.80 0 - 0.80 K/UL    Absolute Eosinophil Count 0.10 0 - 0.45 K/UL    Absolute Basophil Count 0.10 0 - 0.20 K/UL   BASIC METABOLIC PANEL    Collection Time: 09/29/17 10:21 AM   # # Low-High    Sodium 134 (L) 137 - 147 MMOL/L    Potassium 3.6 3.5 - 5.1 MMOL/L    Chloride 100 98 - 110 MMOL/L    CO2 22 21 - 30 MMOL/L Anion Gap 12 3 - 12    Glucose 167 (H) 70 - 100 MG/DL    Blood Urea Nitrogen 15 7 - 25 MG/DL    Creatinine 0.45 0.4 - 1.00 MG/DL    Calcium 9.9 8.5 - 40.9 MG/DL    eGFR Non African American >60 >60 mL/min    eGFR African American >60 >60 mL/min   MAGNESIUM    Collection Time: 09/29/17 10:21 AM   # # Low-High    Magnesium 1.5 (L) 1.6 - 2.6 mg/dL   LACTIC ACID(LACTATE)    Collection Time: 09/29/17 10:21 AM   # # Low-High    Lactic Acid 2.6 (H) 0.5 - 2.0 MMOL/L   BNP (B-TYPE NATRIURETIC PEPTI)    Collection Time: 09/29/17 10:21 AM   # # Low-High    B Type Natriuretic Peptide 1,113.0 (H) 0 - 100 PG/ML   TROPONIN-I    Collection Time: 09/29/17 10:21 AM   # # Low-High    Troponin-I 0.30 (H) 0.0 - 0.05 NG/ML   NOTES  Collection Time: 09/29/17 10:21 AM   # # Low-High    Specimen Notes       EXTRA BLUE TOP THAT WAS SENT DOWN BROKE IN BAG SO NO EXTRA BLUE TOP WAS RECEIVED   PTT (APTT)    Collection Time: 09/29/17 11:58 AM   # # Low-High    APTT 29.4 20.0 - 36.0 SEC   POC GLUCOSE    Collection Time: 09/29/17 12:22 PM   # # Low-High    Glucose, POC 159 (H) 70 - 100 MG/DL   LACTIC ACID(LACTATE)    Collection Time: 09/29/17  3:27 PM   # # Low-High    Lactic Acid 1.2 0.5 - 2.0 MMOL/L   TROPONIN-I    Collection Time: 09/29/17  3:27 PM   # # Low-High    Troponin-I 0.26 (H) 0.0 - 0.05 NG/ML   POC GLUCOSE    Collection Time: 09/29/17  5:02 PM   # # Low-High    Glucose, POC 165 (H) 70 - 100 MG/DL   THYROID STIMULATING HORMONE-TSH    Collection Time: 09/29/17  5:15 PM   # # Low-High    TSH 0.830 0.35 - 5.00 MCU/ML   LIVER FUNCTION PANEL    Collection Time: 09/29/17  5:15 PM   # # Low-High    Total Bilirubin 0.3 0.3 - 1.2 MG/DL    Bilirubin, Direct 0.1 <0.4 MG/DL    Albumin 3.6 3.5 - 5.0 G/DL    Alk Phosphatase 48 25 - 110 U/L    AST (SGOT) 17 7 - 40 U/L    ALT (SGPT) 15 7 - 56 U/L    Total Protein 6.5 6.0 - 8.0 G/DL   PTT (APTT)    Collection Time: 09/29/17  7:00 PM   # # Low-High    APTT 137.3 (HH) 20.0 - 36.0 SEC   POC GLUCOSE Collection Time: 09/29/17  9:19 PM   # # Low-High    Glucose, POC 210 (H) 70 - 100 MG/DL   TROPONIN-I    Collection Time: 09/30/17  2:30 AM   # # Low-High    Troponin-I 0.13 (H) 0.0 - 0.05 NG/ML   BNP (B-TYPE NATRIURETIC PEPTI)    Collection Time: 09/30/17  2:30 AM   # # Low-High    B Type Natriuretic Peptide 1,294.0 (H) 0 - 100 PG/ML   CBC    Collection Time: 09/30/17  2:30 AM   # # Low-High    White Blood Cells 9.2 4.5 - 11.0 K/UL    RBC 3.92 (L) 4.0 - 5.0 M/UL    Hemoglobin 11.4 (L) 12.0 - 15.0 GM/DL    Hematocrit 24.4 (L) 36 - 45 %    MCV 84.7 80 - 100 FL    MCH 29.2 26 - 34 PG    MCHC 34.5 32.0 - 36.0 G/DL    RDW 01.0 (H) 11 - 15 %    Platelet Count 262 150 - 400 K/UL    MPV 8.8 7 - 11 FL   BASIC METABOLIC PANEL    Collection Time: 09/30/17  2:30 AM   # # Low-High    Sodium 133 (L) 137 - 147 MMOL/L    Potassium 4.8 3.5 - 5.1 MMOL/L    Chloride 100 98 - 110 MMOL/L    CO2 22 21 - 30 MMOL/L    Anion Gap 11 3 - 12    Glucose 305 (H) 70 - 100 MG/DL    Blood Urea Nitrogen 16  7 - 25 MG/DL    Creatinine 9.62 0.4 - 1.00 MG/DL    Calcium 9.2 8.5 - 95.2 MG/DL    eGFR Non African American >60 >60 mL/min    eGFR African American >60 >60 mL/min   MAGNESIUM    Collection Time: 09/30/17  2:30 AM   # # Low-High    Magnesium 2.5 1.6 - 2.6 mg/dL   PTT (APTT)    Collection Time: 09/30/17  2:30 AM   # # Low-High    APTT 85.7 (H) 20.0 - 36.0 SEC   TROPONIN-I    Collection Time: 09/30/17  5:45 AM   # # Low-High    Troponin-I 0.10 (H) 0.0 - 0.05 NG/ML   POC GLUCOSE    Collection Time: 09/30/17  8:01 AM   # # Low-High    Glucose, POC 298 (H) 70 - 100 MG/DL   PTT (APTT)    Collection Time: 09/30/17  8:10 AM   # # Low-High    APTT 80.6 (H) 20.0 - 36.0 SEC       Point of Care Testing:  (Last 24 hours):  Glucose: (!) 305 (09/30/17 0230)  POC Glucose (Download): (!) 298 (09/30/17 0801)    Radiology and Other Diagnostic Procedures Review:    Reviewed. Significant main PA burden I have seen, examined and reviewed data concerning this patient.  I discussed the findings and plan of care with the ICU team. I spent 43 minutes in critical care time, excluding procedures today.    Charlies Silvers, MD, MS, CCC/SLP 09/30/2017 9:09 AM  Clinical Instructor, Anesthesiology and Critical Care  [phone]

## 2017-09-30 NOTE — Case Management (ED)
Case Management Progress Note    NAME:Katrina Wang                          MRN: [id number]              DOB:1936/12/20          AGE: 81 y.o.  ADMISSION DATE: 09/29/2017             DAYS ADMITTED: LOS: 1 day      Today???s Date: 09/30/2017    Plan  *Consult with IR  *Bedside echo    Interventions  ? Support   Support: Pt/Family Updates re:POC or DC Plan, Patient Education  ? Info or Referral      ? Discharge Planning      ? Medication Needs   Medication Needs: Co-Pay Check    *Patient states she is okay with xarelto copay of $45 for 30 days and $85 for 90 days, CV 1 team informed    ? Financial      ? Legal      ? Other        Disposition  ? Expected Discharge Date    Expected Discharge Date: 10/02/17  ? Transportation   Does the patient need discharge transport arranged?: No  Transportation Name, Phone and Availability #1: son Harvie Heck [phone]  Does the patient use Medicaid Transportation?: No  ? Discharge Disposition           Durable Medical Equipment     No service has been selected for the patient.      [facility]     No service has been selected for the patient.      [facility]     No service has been selected for the patient.      [facility]     No service has been selected for the patient.          Case Management Admission Assessment    Source of Information: *patient    Plan  Plan: CM Assessment, Assist PRN with SW/NCM Services, Discharge Planning for Home Anticipated   *RNCM met with patient and provided contact information and explanation of CM roles. The patient was encouraged to contact case management with questions and concerns during hospitalization.  *Patient states she is open to [facility] if she needs it but does not anticipate needing HH. This RN CM will readdress HH closer to discharge.   *The CM team will follow patient through course of stay and assist with discharge planning.     Patient Address/Phone  [address] [place] [address]  [phone] (home) Emergency Contact  Extended Emergency Contact Information  Primary Emergency Contact: Zi, Sek [place]  Home Phone: [phone]  Mobile Phone: [phone]  Relation: Son    Editor, commissioning  Healthcare Directive: Yes, patient has a healthcare directive  Type of Healthcare Directive: Healthcare directive  Location of Healthcare Directive: Patient does not have it with him/her  Would patient like to fill out a (a new) Healthcare Directive?: No, patient declined      Transportation  Does the patient need discharge transport arranged?: No  Transportation Name, Phone and Availability #1: son Harvie Heck [phone]  Does the patient use Medicaid Transportation?: No    Expected Discharge Date  Expected Discharge Date: 10/02/17    Living Situation Prior to Admission  ? Living Arrangements  Type of Residence: Home, independent  Living Arrangements: Alone  Bathroom Shower / Tub: Tub/Shower Unit  How many levels in the residence?: 2  Can patient live on one level if needed?: Yes  *Washer and dryer in basement. Patient states she is able to do laundry but has to walk carefully and slowly up and down stairs with the laundry baskets. She walks backwards when going downstairs with basket so that she can use the stairs to help hold the basket.   Does residence have entry and/or side stairs?: Yes (2 steps into home and 9 steps down to basement)  *Patient denies difficulty with stairs before this admission.   Assistance needed prior to admit or anticipated on discharge: Yes (pateint no longer drives; patient has friends who drive her to her doctor appointments and two sons, son Harvie Heck lives in [place])  Who provides assistance or could if needed?: friends and son Harvie Heck  Are they in good health?: Unknown  Can support system provide 24/7 care if needed?: No  ? Level of Function   Prior level of function: Independent  ? Cognitive Abilities   Cognitive Abilities: Alert and Oriented, Engages in problem solving and planning, Participates in decision making    Financial Resources  ? Coverage  Primary Insurance: Medicare ([id number]-MEDICARE PART A AND B Ph: [phone] )  Secondary Insurance: Medicare Supplement ([place] phone: [phone])  Additional Coverage: [employer] [facility] [employer] scripts phone [phone] member [phone])    ? Source of Income   Source Of Income: SSI, Other retirement income  ? Financial Assistance Needed?  No, patient denies need for assistance.     Psychosocial Needs  ? Mental Health  Mental Health History: No  ? Substance Use History  Substance Use History Screen: No  ? Other  na    Current/Previous Services  ? PCP  Steva Ready and Roxine Caddy at [facility], [phone], [phone]    *Cardiologist: Larwance Sachs @[facility]  phone: [phone]    ? Pharmacy    [facility] [address], [place] - [address] [place] [address] [place] [address]  [place] [address]  Phone: [phone] Fax: [phone]    [facility]  [address] [address]  [place] [address]  Phone: [phone] Fax: [phone]    ? 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 Home Health  Receiving home health: In the past  Agency name: [facility] phone: [phone]  Would patient use this agency again?: Yes  ? Hemodialysis or Peritoneal Dialysis  Undergoing hemodialysis or peritoneal dialysis: No  ? Tube/Enteral Feeds  Receive tube/enteral feeds: No  ? Infusion  Receive infusions: No  ? Private Duty  Private duty help used: No  ? Home and Community Based Services  Home and community based services: No  ? [employer]: N/A  ? Hospice  Hospice: No  ? Outpatient Therapy  PT: In the past  When did patient receive care?: one year ago  Name of rehab location/group: [facility]  Would patient return for future services?: Yes  OT: No  SLP: No ? Skilled Nursing Facility/Nursing Home  SNF: No  NH: No  ? Inpatient Rehab  IPR: Yes  When did patient receive care?: 1 year ago  Name of Facility: [facility] phone: [phone]  Would patient return for future services?: Yes  ? [facility] Acute [facility] Hospital  LTACH: No  ?  Acute Hospital Stay  Acute Hospital Stay: In the past  Was patient's stay within the last 30 days?: No  When did patient receive care?: 10/15/16    Verdie Shire RN, BSN, MSN  Integrated Nursing Case Manager  Office [phone] M-F 8-5 pm

## 2017-09-30 NOTE — Progress Notes
Pt taken to CT with RN x1 at 0602 via wheelchair with O2 and monitor. VSS. Pt tolerated CT well and was brought back to CICU room 903 via wheelchair with RN x1 at [phone]. VS remain stable upon return. Pt denies any needs/concerns at this time. Will continue to monitor.

## 2017-09-30 NOTE — Other
Immediate Post Procedure Note    Date:  09/30/2017                                         Attending Physician:   Dr. Ree Kida  Performing Provider:  Harriet Butte, MD    Consent:  Consent obtained from patient.  Time out performed: Consent obtained, correct patient verified, correct procedure verified, correct site verified, patient marked as necessary.  Pre/Post Procedure Diagnosis:  Submassive PE  Indications:  Submassive PE    Anesthesia: Conscious Sedation  Procedure(s):  PE clot aspiration and placement of thrombolysis catheters  Findings:  PE within the right and left pulmonary arteries.  Aspiration was used with some improvement.  Lysis catheters placed bilaterally with tPa set to drip overnight.  Patient will return tomorrow for lysis check and probable removal.    Estimated Blood Loss:  ~471mL  Specimen(s) Removed/Disposition:  None  Complications: None  Patient Tolerated Procedure: Well  Post-Procedure Condition:  stable    Harriet Butte, MD  Pager [phone]

## 2017-09-30 NOTE — Progress Notes
Sedation physician present in room. Recent vitals and patient condition reviewed between sedating physician and nurse. Reassessment completed. Determination made to proceed with planned sedation.

## 2017-09-30 NOTE — H&P (View-Only)
IR History and Physical Summary Note      Name:Katrina Wang                                                                   MRN: [id number]                 DOB:[birth year]          Age: 81 y.o.  Admission Date: 09/29/2017             Days Admitted: LOS: 1 day      Procedure Date: 09/30/2017     Planned Procedures:  PE thrombolysis  Indications:  Submassive PE    Allergies:  Imdur [isosorbide mononitrate]; Iodinated contrast- oral and iv dye; Lisinopril; and Atorvastatin    Lab/Other Diagnostic Tests:     24-hour labs:    Results for orders placed or performed during the hospital encounter of 09/29/17 (from the past 24 hour(s))   POC GLUCOSE    Collection Time: 09/29/17  5:02 PM   Result Value Ref Range    Glucose, POC 165 (H) 70 - 100 MG/DL   THYROID STIMULATING HORMONE-TSH    Collection Time: 09/29/17  5:15 PM   Result Value Ref Range    TSH 0.830 0.35 - 5.00 MCU/ML   HEMOGLOBIN A1C    Collection Time: 09/29/17  5:15 PM   Result Value Ref Range    Hemoglobin A1C 7.9 (H) 4.0 - 6.0 %   LIVER FUNCTION PANEL    Collection Time: 09/29/17  5:15 PM   Result Value Ref Range    Total Bilirubin 0.3 0.3 - 1.2 MG/DL    Bilirubin, Direct 0.1 <0.4 MG/DL    Albumin 3.6 3.5 - 5.0 G/DL    Alk Phosphatase 48 25 - 110 U/L    AST (SGOT) 17 7 - 40 U/L    ALT (SGPT) 15 7 - 56 U/L    Total Protein 6.5 6.0 - 8.0 G/DL   PTT (APTT)    Collection Time: 09/29/17  7:00 PM   Result Value Ref Range    APTT 137.3 (HH) 20.0 - 36.0 SEC   POC GLUCOSE    Collection Time: 09/29/17  9:19 PM   Result Value Ref Range    Glucose, POC 210 (H) 70 - 100 MG/DL   TROPONIN-I    Collection Time: 09/30/17  2:30 AM   Result Value Ref Range    Troponin-I 0.13 (H) 0.0 - 0.05 NG/ML   BNP (B-TYPE NATRIURETIC PEPTI)    Collection Time: 09/30/17  2:30 AM   Result Value Ref Range    B Type Natriuretic Peptide 1,294.0 (H) 0 - 100 PG/ML   CBC    Collection Time: 09/30/17  2:30 AM   Result Value Ref Range White Blood Cells 9.2 4.5 - 11.0 K/UL    RBC 3.92 (L) 4.0 - 5.0 M/UL    Hemoglobin 11.4 (L) 12.0 - 15.0 GM/DL    Hematocrit 86.5 (L) 36 - 45 %    MCV 84.7 80 - 100 FL    MCH 29.2 26 - 34 PG    MCHC 34.5 32.0 - 36.0 G/DL    RDW 78.4 (H) 11 - 15 %    Platelet Count 262 150 -  400 K/UL    MPV 8.8 7 - 11 FL   BASIC METABOLIC PANEL    Collection Time: 09/30/17  2:30 AM   Result Value Ref Range    Sodium 133 (L) 137 - 147 MMOL/L    Potassium 4.8 3.5 - 5.1 MMOL/L    Chloride 100 98 - 110 MMOL/L    CO2 22 21 - 30 MMOL/L    Anion Gap 11 3 - 12    Glucose 305 (H) 70 - 100 MG/DL    Blood Urea Nitrogen 16 7 - 25 MG/DL    Creatinine 9.60 0.4 - 1.00 MG/DL    Calcium 9.2 8.5 - 45.4 MG/DL    eGFR Non African American >60 >60 mL/min    eGFR African American >60 >60 mL/min   MAGNESIUM    Collection Time: 09/30/17  2:30 AM   Result Value Ref Range    Magnesium 2.5 1.6 - 2.6 mg/dL   PTT (APTT)    Collection Time: 09/30/17  2:30 AM   Result Value Ref Range    APTT 85.7 (H) 20.0 - 36.0 SEC   TROPONIN-I    Collection Time: 09/30/17  5:45 AM   Result Value Ref Range    Troponin-I 0.10 (H) 0.0 - 0.05 NG/ML   POC GLUCOSE    Collection Time: 09/30/17  8:01 AM   Result Value Ref Range    Glucose, POC 298 (H) 70 - 100 MG/DL   PTT (APTT)    Collection Time: 09/30/17  8:10 AM   Result Value Ref Range    APTT 80.6 (H) 20.0 - 36.0 SEC   POC GLUCOSE    Collection Time: 09/30/17 12:02 PM   Result Value Ref Range    Glucose, POC 310 (H) 70 - 100 MG/DL         History and Physical Update: I have examined the patient, and there are no significant changes in their condition, from the previous H&P performed on 09/29/17.    Previous Anesthetic/Sedation History: no adverse events    Airway:  airway assessment performed  Mallampati II (soft palate, uvula, fauces visible)  Anesthesia Classification:  ASA III (A patient with a severe systemic disease that limits activity, but is not incapacitating)  NPO Status: Acceptable  Pregnancy Status: Not Pregnant Sedation/Medication Plan:  Conscious sedation  Discussion/Reviews: Physician has discussed risks and alternatives of this type of sedation and above planned procedures with patient      Harriet Butte, MD  Pager [phone]

## 2017-09-30 NOTE — Progress Notes
CRITICAL RESULT NOTIFICATION    Critical result or procedure called (document test and value, and read back): Leg ultrasound: Incompletely occlusive thrombus within the left popliteal vein with   additional thrombus in the left posterior tibial veins.  No evidence of femoral popliteal DVT in the right lower extremity.  Time MD/NP Notified: 1415  MD/NP Name: Dr. Marliss Czar  MD/NP Response/Orders Given: Will continue heparin and continue to monitor.

## 2017-10-01 DIAGNOSIS — R0602 Shortness of breath: Secondary | ICD-10-CM

## 2017-10-01 LAB — POC GLUCOSE
Lab: 320 mg/dL — ABNORMAL HIGH (ref 70–100)
Lab: 423 mg/dL — ABNORMAL HIGH (ref 70–100)
Lab: 337 mg/dL — ABNORMAL HIGH (ref 70–100)
Lab: 232 mg/dL — ABNORMAL HIGH (ref 70–100)
Lab: 231 mg/dL — ABNORMAL HIGH (ref 70–100)
Lab: 206 mg/dL — ABNORMAL HIGH (ref 70–100)
Lab: 170 mg/dL — ABNORMAL HIGH (ref 70–100)

## 2017-10-01 LAB — CBC
Lab: 14 K/UL — ABNORMAL HIGH (ref >60)
Lab: 14 10*3/uL — ABNORMAL HIGH (ref 4.5–11.0)
Lab: 15 % — ABNORMAL HIGH (ref 11–15)
Lab: 28 pg (ref 26–34)
Lab: 29 % — ABNORMAL LOW (ref 36–45)
Lab: 3.4 M/UL — ABNORMAL LOW (ref 4.0–5.0)
Lab: 33 g/dL (ref 32.0–36.0)
Lab: 85 FL (ref 80–100)
Lab: 9.6 g/dL — ABNORMAL LOW (ref 12.0–15.0)

## 2017-10-01 LAB — MAGNESIUM: Lab: 2.2 mg/dL — ABNORMAL LOW (ref >60)

## 2017-10-01 LAB — HEMOGLOBIN: Lab: 9.5 g/dL — ABNORMAL LOW (ref 12.0–15.0)

## 2017-10-01 LAB — BASIC METABOLIC PANEL: Lab: 135 MMOL/L — ABNORMAL LOW (ref >60)

## 2017-10-01 MED ORDER — FENTANYL CITRATE (PF) 50 MCG/ML IJ SOLN
0 refills | Status: DC
Start: 2017-10-01 — End: 2017-10-04
  Administered 2017-10-01: 50 ug via INTRAVENOUS

## 2017-10-01 MED ORDER — DIPHENHYDRAMINE HCL 50 MG PO CAP
50 mg | Freq: Once | ORAL | 0 refills | Status: AC | PRN
Start: 2017-10-01 — End: ?

## 2017-10-01 MED ORDER — IOPAMIDOL 61 % IV SOLN
60 mL | Freq: Once | INTRAVENOUS | 0 refills | Status: CP
Start: 2017-10-01 — End: ?
  Administered 2017-10-02: 60 mL via INTRAVENOUS

## 2017-10-01 MED ORDER — DIPHENHYDRAMINE HCL 50 MG/ML IJ SOLN
50 mg | Freq: Once | INTRAVENOUS | 0 refills | Status: CP
Start: 2017-10-01 — End: ?
  Administered 2017-10-01: 50 mg via INTRAVENOUS

## 2017-10-01 MED ORDER — METHYLPREDNISOLONE 16 MG PO TAB
32 mg | Freq: Once | ORAL | 0 refills | Status: AC | PRN
Start: 2017-10-01 — End: ?

## 2017-10-01 MED ORDER — MIDAZOLAM 1 MG/ML IJ SOLN
0 refills | Status: DC
Start: 2017-10-01 — End: 2017-10-04
  Administered 2017-10-01: 1 mg via INTRAVENOUS

## 2017-10-01 MED ORDER — METHYLPREDNISOLONE SOD SUC(PF) 125 MG/2 ML IJ SOLR
125 mg | Freq: Once | INTRAVENOUS | 0 refills | Status: CP
Start: 2017-10-01 — End: ?
  Administered 2017-10-01: 125 mg via INTRAVENOUS

## 2017-10-01 NOTE — Progress Notes
Patient premedication for contrast allergy at this time per orders. See [facility]

## 2017-10-01 NOTE — Progress Notes
Pt transferred to [facility] by three RNs. VSS, no s/s of acute distress noted; denies pain and nausea. Right femoral venous sheath in place, heparin/tpa infusing per MD orders, gauze dressing CDI at this time. Left femoral venous sheath in place, heparin/tpa infusing per MD orders, gauze dressing CDI at this time. Bedrest with HOB flat until sheath removal, per Dr Ree Kida. Pt to come to IR tomorrow for lysis check, NPO at midnight. Bedside report given to [facility], ICU RN.

## 2017-10-01 NOTE — Progress Notes
Critical Care Progress Note          Today's Date:  10/01/2017  Name:  Katrina Wang                       MRN:  [id number]   Admission Date: 09/29/2017  LOS: 2 days                     Assessment/Plan:   Principal Problem:    Acute saddle pulmonary embolism (HCC)  Active Problems:    CAD (coronary artery disease), native coronary artery    PVD (peripheral vascular disease) (HCC)    Carotid artery disease (HCC)    HLD (hyperlipidemia)    Essential hypertension    NSTEMI Type II Secondary to Acute PE    Acute respiratory failure with hypoxia (HCC)    Hypertensive emergency    Near syncope    Chronic diastolic CHF (congestive heart failure) (HCC)    Type 2 diabetes mellitus (HCC)    S/P CABG (coronary artery bypass graft)    Dyspnea on exertion    Syncope    Renal artery stenosis (HCC)    DVT (deep vein thrombosis) in pregnancy [facility])    Hypothyroidism      81 y.o. female with Acute saddle pulmonary embolism (HCC)    Neuro: Syncope, Depression, Chronic Pain  PRN pain medication. Concern for PE as etiology for syncope, workup in progress, TTE supports this. Assess daily for delirium. Continue home Lexapro. Monitor fos s/sx CVA with concern for PE.    Cardiac: Concern for PE with syncope and dyspnea, HTN, HLD, hx NSTEMI, CAD s/p PCI (OMB stent 1999) s/p CABG x 2 09/2016, Carotid artery stenosis s/p L CEA, severe PVD s/p b/l iliofemoral bypass, chronic HFpEF (60%)  Stronly suspect PE with evidence of RV strain and elevated PAP on TTE. Continue home ASA, metoprolol, statin. Heparin gtt for PE. Pt has contrast allergy and premedicating for CTA Chest. Pretreat with benadryl, methylpred. Troponins with modest peak at 0.30 --> 0.26, BNP 1100, LA 2.6 --> 1.2. Continue to trend. EKG with no acute S1Q3T3 concerns, or changes. BLE venous dopplers pending.  10/4: CTA with significant bilateral PA clot burden. L popliteal thrombus on ulstrasound. Renal artery duplex pending. Troponin trending down. BNP remains elevated with RV strain.  10/5: thrombectomy and tPA catheter placed by IR yesterday PM. Tolerated well. Continue heparin gtt, DOAC prior to dc. IR return today after pretreatment. Will need TTE in 3 months, will probably perform over the weekend to monitor acute RV strain. Troponins normalizing - likely elevated from RV strain. Continue CHF medications.  Respiratory:  Concern for PE, progressive dyspnea, acute hypoxic respiratory failure, dyspnea on exertion  Tolerating 4L O2 via NAC since admission for SpO2 > 92%. May need to send to CTA sooner if oxygen demands increase.  10/4: Definite PE on CTA. Investigating source. Tolerating 4L NAC, one desat in CT  10/5: No changes. Thrombectomy/thrombolysis yesterday as above.  GI: No acute concerns  Continue bowel regimen, anti-emetics. NPO for procedures as needed. Cardiac diet. Check LFTs.  Heme: Concern for PE, need for anticoagulation/antiplatelet  Heparin gtt started. Continue home ASA at lower dose. No significant derangements in hematopoetic cell lines.   10/5: Needs DOAC prior to dc. Thrombus seen left popliteal and posterior tibial on [place].  ID: Hx uterine ca s/p hysterectomy, leukocytosis  Mild leukocytosis, no left shift. Likely reactive. Concern for etiology of possible PE with  cancer hx. Pt also has significant activity restrictions 2/2 comorbidities.  Renal: Renal artery stenosis, DM2, hx AKI      Pt with chronic severe hypertension. MRI 07/2016 showed bilateral renal stenosis (moderate on right and moderate to severe on left). Also concerns for stenosis of major aortic trunks - difficult to visualize. May discuss with CV1 and add renal artery duplex +/- aortic runoff to CTA. Cr at baseline (0.87)  10/4: f/u renal artery stenosis  10/5: Renal artery duplex shows no critical stenoses  FEN: Hypomagnesemia, DM2  Electrolyte goals while in ICU:  Mg >2.0, iCal > 1.0, K+ >4.0 mEq/L.  Insulin per Modified [facility] Protocol as needed. Check TSH - elevated in 10/2016. Continue home levothyroxine.  Activity: Bedrest  ???  Dispo:  Requires ICU for treatment of PE, evaluate syncope, mild respiratory insuffiency, monitoring of thrombolytic  ???  Lines:  PIV x 2  Urinary Catheter:  No  ABX: no recent antibiotics  DVT PPx: Heparin  GI PPx: none needed - tolerating cardiac diet PO  _____________________________________________________________________________    Subjective:  Katrina Wang is a 81 y.o. female.      Overnight Events: No new events noted.      Objective:  Medications:  Scheduled Meds:    amLODIPine (NORVASC) tablet 10 mg 10 mg Oral QDAY   aspirin EC tablet 81 mg 81 mg Oral QDAY   escitalopram oxalate (LEXAPRO) tablet 5 mg 5 mg Oral QDAY   heparin (porcine) BOLUS for continuous inf (bag) [phone] Units 20-40 Units/kg Intravenous As Prescribed   insulin aspart U-100 (NOVOLOG FLEXPEN) injection PEN 0-7 Units 0-7 Units Subcutaneous ACHS   levothyroxine (SYNTHROID) tablet 100 mcg 100 mcg Oral QDAY   loratadine (CLARITIN) tablet 10 mg 10 mg Oral QAM8   metoprolol tartrate (LOPRESSOR) tablet 37.5 mg 37.5 mg Oral BID   rosuvastatin (CRESTOR) tablet 20 mg 20 mg Oral QDAY   senna/docusate (SENOKOT-S) tablet 2 tablet 2 tablet Oral BID   Continuous Infusions:  ??? alteplase (CATHFLO ACTIVASE) 10 mg in sodium chloride 0.9% (NS) 500 mL infusion 0.5 mg/hr (10/01/17 0724)   ??? alteplase (CATHFLO ACTIVASE) 10 mg in sodium chloride 0.9% (NS) 500 mL infusion 0.5 mg/hr (10/01/17 0724)   ??? heparin (porcine) 20,000 units/D5W 500 mL infusion (std conc)(premade) 500 Units/hr (10/01/17 0725)   ??? heparin (porcine) 20,000 units/D5W 500 mL infusion (std conc)(premade) 500 Units/hr (10/01/17 0724)     PRN and Respiratory Meds:acetaminophen Q6H PRN, benzocaine/menthol Q2H PRN, diphenhydrAMINE Once PRN, methylprednisolone Once PRN, nitroglycerin Q5 MIN PRN, ondansetron Q6H PRN                       Vital Signs: Last Filed                  Vital Signs: 24 Hour Range   BP: 144/66 (10/05 1200) Temp: 36.6 ???C (97.9 ???F) (10/05 1200)  Pulse: 74 (10/05 1200)  Respirations: 20 PER MINUTE (10/05 1200)  SpO2: 96 % (10/05 1200)  O2 Delivery: Nasal Cannula (10/05 1200)  Weight: 80.6 kg (177 lb 11.1 oz) (10/05 0400)  BP: (92-150)/(58-95)   Temp:  [35.8 ???C (96.5 ???F)-36.6 ???C (97.9 ???F)]   Pulse:  [73-103]   Respirations:  [10 PER MINUTE-30 PER MINUTE]   SpO2:  [89 %-100 %]   O2 Delivery: Nasal Cannula    Intensity Pain Scale (Self Report): (not recorded) Vitals:    09/30/17 0400 09/30/17 1028 10/01/17 0400   Weight: 80.2 kg (  176 lb 12.8 oz) 80.3 kg (177 lb) 80.6 kg (177 lb 11.1 oz)         Critical Care Vitals:      ICP Monitoring:     PA  Catheter:     Hemodynamics/Oxycalcs:       Intake/Output Summary:  (Last 24 hours)    Intake/Output Summary (Last 24 hours) at 10/01/17 1301  Last data filed at 10/01/17 1200   Gross per 24 hour   Intake          2498.64 ml   Output             1955 ml   Net           543.64 ml         Physical Exam:    General: no apparent distress and alert & oriented x4  Neurologic:  AOx3. Gait normal. Cranial nerves 2-12 and sensation grossly intact.  HEENT:   NCAT w/o lesions or tenderness   conjunctivae/corneas clear. PERRL, EOM's intact.   supple, symmetrical, trachea midline, no adenopathy, thyroid: not enlarged, symmetric, no tenderness/mass/nodules,  no carotid bruit and no JVD  Heart: regular rate and rhythm, S1, S2 normal, no murmur, click, rub or gallop  -  Lungs: Normal chest wall and respirations. Clear to auscultation.              Abdomen: soft, non-tender. Bowel sounds normal. No masses,  no organomegaly  MSK: negative  Integumentary: no rashes, no ecchymoses, no petechiae, no purpura  Extremities: extremities normal, trace edema    Lab Review:    Results for orders placed or performed during the hospital encounter of 09/29/17 (from the past 48 hour(s))   LACTIC ACID(LACTATE)    Collection Time: 09/29/17  3:27 PM   # # Low-High    Lactic Acid 1.2 0.5 - 2.0 MMOL/L   TROPONIN-I Collection Time: 09/29/17  3:27 PM   # # Low-High    Troponin-I 0.26 (H) 0.0 - 0.05 NG/ML   POC GLUCOSE    Collection Time: 09/29/17  5:02 PM   # # Low-High    Glucose, POC 165 (H) 70 - 100 MG/DL   THYROID STIMULATING HORMONE-TSH    Collection Time: 09/29/17  5:15 PM   # # Low-High    TSH 0.830 0.35 - 5.00 MCU/ML   HEMOGLOBIN A1C    Collection Time: 09/29/17  5:15 PM   # # Low-High    Hemoglobin A1C 7.9 (H) 4.0 - 6.0 %   LIVER FUNCTION PANEL    Collection Time: 09/29/17  5:15 PM   # # Low-High    Total Bilirubin 0.3 0.3 - 1.2 MG/DL    Bilirubin, Direct 0.1 <0.4 MG/DL    Albumin 3.6 3.5 - 5.0 G/DL    Alk Phosphatase 48 25 - 110 U/L    AST (SGOT) 17 7 - 40 U/L    ALT (SGPT) 15 7 - 56 U/L    Total Protein 6.5 6.0 - 8.0 G/DL   PTT (APTT)    Collection Time: 09/29/17  7:00 PM   # # Low-High    APTT 137.3 (HH) 20.0 - 36.0 SEC   POC GLUCOSE    Collection Time: 09/29/17  9:19 PM   # # Low-High    Glucose, POC 210 (H) 70 - 100 MG/DL   TROPONIN-I    Collection Time: 09/30/17  2:30 AM   # # Low-High    Troponin-I 0.13 (H) 0.0 - 0.05  NG/ML   BNP (B-TYPE NATRIURETIC PEPTI)    Collection Time: 09/30/17  2:30 AM   # # Low-High    B Type Natriuretic Peptide 1,294.0 (H) 0 - 100 PG/ML   CBC    Collection Time: 09/30/17  2:30 AM   # # Low-High    White Blood Cells 9.2 4.5 - 11.0 K/UL    RBC 3.92 (L) 4.0 - 5.0 M/UL    Hemoglobin 11.4 (L) 12.0 - 15.0 GM/DL    Hematocrit 45.4 (L) 36 - 45 %    MCV 84.7 80 - 100 FL    MCH 29.2 26 - 34 PG    MCHC 34.5 32.0 - 36.0 G/DL    RDW 09.8 (H) 11 - 15 %    Platelet Count 262 150 - 400 K/UL    MPV 8.8 7 - 11 FL   BASIC METABOLIC PANEL    Collection Time: 09/30/17  2:30 AM   # # Low-High    Sodium 133 (L) 137 - 147 MMOL/L    Potassium 4.8 3.5 - 5.1 MMOL/L    Chloride 100 98 - 110 MMOL/L    CO2 22 21 - 30 MMOL/L    Anion Gap 11 3 - 12    Glucose 305 (H) 70 - 100 MG/DL    Blood Urea Nitrogen 16 7 - 25 MG/DL    Creatinine 1.19 0.4 - 1.00 MG/DL    Calcium 9.2 8.5 - 14.7 MG/DL eGFR Non African American >60 >60 mL/min    eGFR African American >60 >60 mL/min   MAGNESIUM    Collection Time: 09/30/17  2:30 AM   # # Low-High    Magnesium 2.5 1.6 - 2.6 mg/dL   PTT (APTT)    Collection Time: 09/30/17  2:30 AM   # # Low-High    APTT 85.7 (H) 20.0 - 36.0 SEC   TROPONIN-I    Collection Time: 09/30/17  5:45 AM   # # Low-High    Troponin-I 0.10 (H) 0.0 - 0.05 NG/ML   POC GLUCOSE    Collection Time: 09/30/17  8:01 AM   # # Low-High    Glucose, POC 298 (H) 70 - 100 MG/DL   PTT (APTT)    Collection Time: 09/30/17  8:10 AM   # # Low-High    APTT 80.6 (H) 20.0 - 36.0 SEC   POC GLUCOSE    Collection Time: 09/30/17 12:02 PM   # # Low-High    Glucose, POC 310 (H) 70 - 100 MG/DL   POC GLUCOSE    Collection Time: 09/30/17  7:35 PM   # # Low-High    Glucose, POC 320 (H) 70 - 100 MG/DL   POC GLUCOSE    Collection Time: 09/30/17 11:01 PM   # # Low-High    Glucose, POC 423 (H) 70 - 100 MG/DL   POC GLUCOSE    Collection Time: 10/01/17  1:07 AM   # # Low-High    Glucose, POC 337 (H) 70 - 100 MG/DL   POC GLUCOSE    Collection Time: 10/01/17  4:13 AM   # # Low-High    Glucose, POC 232 (H) 70 - 100 MG/DL   CBC    Collection Time: 10/01/17  4:15 AM   # # Low-High    White Blood Cells 14.8 (H) 4.5 - 11.0 K/UL    RBC 3.37 (L) 4.0 - 5.0 M/UL    Hemoglobin 9.5 (L) 12.0 - 15.0 GM/DL  Hematocrit 28.5 (L) 36 - 45 %    MCV 84.7 80 - 100 FL    MCH 28.1 26 - 34 PG    MCHC 33.2 32.0 - 36.0 G/DL    RDW 16.1 (H) 11 - 15 %    Platelet Count 232 150 - 400 K/UL    MPV 9.1 7 - 11 FL   BASIC METABOLIC PANEL    Collection Time: 10/01/17  4:15 AM   # # Low-High    Sodium 135 (L) 137 - 147 MMOL/L    Potassium 4.0 3.5 - 5.1 MMOL/L    Chloride 104 98 - 110 MMOL/L    CO2 23 21 - 30 MMOL/L    Anion Gap 8 3 - 12    Glucose 237 (H) 70 - 100 MG/DL    Blood Urea Nitrogen 29 (H) 7 - 25 MG/DL    Creatinine 0.96 0.4 - 1.00 MG/DL    Calcium 8.7 8.5 - 04.5 MG/DL    eGFR Non African American >60 >60 mL/min    eGFR African American >60 >60 mL/min MAGNESIUM    Collection Time: 10/01/17  4:15 AM   # # Low-High    Magnesium 2.2 1.6 - 2.6 mg/dL   POC GLUCOSE    Collection Time: 10/01/17  8:08 AM   # # Low-High    Glucose, POC 231 (H) 70 - 100 MG/DL   CBC    Collection Time: 10/01/17  8:20 AM   # # Low-High    White Blood Cells 14.5 (H) 4.5 - 11.0 K/UL    RBC 3.41 (L) 4.0 - 5.0 M/UL    Hemoglobin 9.6 (L) 12.0 - 15.0 GM/DL    Hematocrit 40.9 (L) 36 - 45 %    MCV 85.0 80 - 100 FL    MCH 28.3 26 - 34 PG    MCHC 33.2 32.0 - 36.0 G/DL    RDW 81.1 (H) 11 - 15 %    Platelet Count 224 150 - 400 K/UL    MPV 9.0 7 - 11 FL   POC GLUCOSE    Collection Time: 10/01/17 11:36 AM   # # Low-High    Glucose, POC 206 (H) 70 - 100 MG/DL       Point of Care Testing:  (Last 24 hours):  Glucose: (!) 237 (10/01/17 0415)  POC Glucose (Download): (!) 206 (10/01/17 1136)    Radiology and Other Diagnostic Procedures Review:    Reviewed. Significant main PA burden    I have seen, examined and reviewed data concerning this patient.  I discussed the findings and plan of care with the ICU team. I spent 44 minutes in critical care time, excluding procedures today.    Charlies Silvers, MD, MS, CCC/SLP 10/01/2017 1:01 PM  Clinical Instructor, Anesthesiology and Critical Care  [phone]

## 2017-10-01 NOTE — Progress Notes
Dr. Lyn Hollingshead notified of pt's glucose result of 320 at 1940. Verbal order received to give 5 units SQ Novolog per ordered sliding scale then re-check 2 hours after pt finished eating and report results obtained. Dr. Lyn Hollingshead notified of glucose of 423 at 2305; verbal order received to give 10 units Novolog SQ instead of 6 for HS sliding scale dose. Then to re-check glucose in 2 hours and report results if greater than 300. Glucose of 337 reported to Dr. Lyn Hollingshead at [facility] and verbal order received to give 5 units SQ insulin x1, then re-check glucose at 0400 with morning lab draw and report results. Dr. Lyn Hollingshead notified of glucose of 232 at 0430. Verbal order received to resume monitoring glucose AC/HS and follow ordered sliding scale dosing at 0700, with no insulin to be given at this time.    Additionally, Dr. Lyn Hollingshead notified of pt's drop in hemoglobin from 11.4 on 09/30/17 at 0230 to 9.5 at 0415 this morning. Pt remains hemodynamically stable with no visible signs of bleeding at this time. Dr. Lyn Hollingshead at pt's bedside performing exam at 0510.

## 2017-10-02 DIAGNOSIS — R0602 Shortness of breath: Secondary | ICD-10-CM

## 2017-10-02 LAB — POC GLUCOSE
Lab: 215 mg/dL — ABNORMAL HIGH (ref 70–100)
Lab: 405 mg/dL — ABNORMAL HIGH (ref 70–100)
Lab: 347 mg/dL — ABNORMAL HIGH (ref 70–100)
Lab: 358 mg/dL — ABNORMAL HIGH (ref 70–100)
Lab: 369 mg/dL — ABNORMAL HIGH (ref 70–100)

## 2017-10-02 LAB — CBC
Lab: 10 10*3/uL (ref 4.5–11.0)
Lab: 15 % — ABNORMAL HIGH (ref 11–15)
Lab: 186 10*3/uL (ref 150–400)
Lab: 28 pg (ref 26–34)
Lab: 3.5 M/UL — ABNORMAL LOW (ref 4.0–5.0)
Lab: 30 % — ABNORMAL LOW (ref 36–45)
Lab: 32 g/dL (ref 32.0–36.0)
Lab: 86 FL (ref 80–100)
Lab: 9.2 FL (ref 7–11)
Lab: 9.9 g/dL — ABNORMAL LOW (ref 12.0–15.0)
Lab: 9.8 K/UL — ABNORMAL LOW (ref 4.5–11.0)

## 2017-10-02 LAB — BASIC METABOLIC PANEL
Lab: 135 MMOL/L — ABNORMAL LOW (ref 137–147)
Lab: 4.7 MMOL/L — ABNORMAL LOW (ref 3.5–5.1)

## 2017-10-02 LAB — PTT (APTT)
Lab: 20 s (ref 20.0–36.0)
Lab: 96 s — ABNORMAL HIGH (ref 20.0–36.0)
Lab: 122 s — ABNORMAL HIGH (ref 20.0–36.0)

## 2017-10-02 MED ORDER — HEPARIN (PORCINE) BOLUS FOR CONTINUOUS INF (BAG)
20-40 [IU]/kg | INTRAVENOUS | 0 refills | Status: DC
Start: 2017-10-02 — End: 2017-10-02

## 2017-10-02 MED ORDER — HEPARIN (PORCINE) IN 5 % DEX 20,000 UNIT/500 ML (40 UNIT/ML) IV SOLP
0-2000 [IU]/h | INTRAVENOUS | 0 refills | Status: DC
Start: 2017-10-02 — End: 2017-10-02
  Administered 2017-10-02: 09:00:00 1451 [IU]/h via INTRAVENOUS

## 2017-10-02 MED ORDER — APIXABAN 5 MG PO TAB
5 mg | Freq: Two times a day (BID) | ORAL | 0 refills | Status: DC
Start: 2017-10-02 — End: 2017-10-04

## 2017-10-02 MED ORDER — HEPARIN (PORCINE) IN 5 % DEX 20,000 UNIT/500 ML (40 UNIT/ML) IV SOLP
0-2000 [IU]/h | INTRAVENOUS | 0 refills | Status: AC
Start: 2017-10-02 — End: ?
  Administered 2017-10-02: 22:00:00 1451 [IU]/h via INTRAVENOUS

## 2017-10-02 MED ORDER — APIXABAN 5 MG PO TAB
10 mg | Freq: Two times a day (BID) | ORAL | 0 refills | Status: DC
Start: 2017-10-02 — End: 2017-10-04
  Administered 2017-10-03 – 2017-10-04 (×4): 10 mg via ORAL

## 2017-10-02 MED ORDER — HEPARIN (PORCINE) BOLUS FOR CONTINUOUS INF (BAG)
20-40 [IU]/kg | INTRAVENOUS | 0 refills | Status: AC
Start: 2017-10-02 — End: ?

## 2017-10-02 MED ORDER — INSULIN ASPART 100 UNIT/ML SC FLEXPEN
0-14 [IU] | Freq: Before meals | SUBCUTANEOUS | 0 refills | Status: DC
Start: 2017-10-02 — End: 2017-10-04

## 2017-10-02 NOTE — Other
Immediate Post Procedure Note    Date:  10/01/2017                                         Attending Physician:   Dr. Thomasena Edis  Performing Provider:  Harriet Butte, MD    Consent:  Consent obtained from patient.  Time out performed: Consent obtained, correct patient verified, correct procedure verified, correct site verified, patient marked as necessary.  Pre/Post Procedure Diagnosis:  PE  Indications:  Thrombolysis of PE    Anesthesia: None  Procedure(s):  Pulmonary arteriogram  Findings:  Pulmonary arteriogram demonstrates resolution of bilateral PEs, lysis catheters and sheaths removed.    Estimated Blood Loss:  None/Negligible  Specimen(s) Removed/Disposition:  None  Complications: None  Patient Tolerated Procedure: Well  Post-Procedure Condition:  stable    Harriet Butte, MD  Pager [phone]

## 2017-10-02 NOTE — Progress Notes
Waiting to remove foley per patient request due to bladder scans and fatigue of patient since she has not slept due to the hourly groin site checks.

## 2017-10-02 NOTE — Progress Notes
Critical Care Progress Note          Today's Date:  10/02/2017  Name:  Katrina Wang                       MRN:  [id number]   Admission Date: 09/29/2017  LOS: 3 days                     Assessment/Plan:   Principal Problem:    Acute saddle pulmonary embolism (HCC)  Active Problems:    CAD (coronary artery disease), native coronary artery    PVD (peripheral vascular disease) (HCC)    Carotid artery disease (HCC)    HLD (hyperlipidemia)    Essential hypertension    NSTEMI Type II Secondary to Acute PE    Acute respiratory failure with hypoxia (HCC)    Hypertensive emergency    Near syncope    Chronic diastolic CHF (congestive heart failure) (HCC)    Type 2 diabetes mellitus (HCC)    S/P CABG (coronary artery bypass graft)    Dyspnea on exertion    Syncope    Renal artery stenosis (HCC)    DVT (deep vein thrombosis) in pregnancy [facility])    Hypothyroidism      81 y.o. female with Acute saddle pulmonary embolism (HCC)    Neuro: Syncope, Depression, Chronic Pain  PRN pain medication. Concern for PE as etiology for syncope, workup in progress, TTE supports this. Assess daily for delirium. Continue home Lexapro. Monitor fos s/sx CVA with concern for PE.    Cardiac: Concern for PE with syncope and dyspnea, HTN, HLD, hx NSTEMI, CAD s/p PCI (OMB stent 1999) s/p CABG x 2 09/2016, Carotid artery stenosis s/p L CEA, severe PVD s/p b/l iliofemoral bypass, chronic HFpEF (60%)  Stronly suspect PE with evidence of RV strain and elevated PAP on TTE. Continue home ASA, metoprolol, statin. Heparin gtt for PE. Pt has contrast allergy and premedicating for CTA Chest. Pretreat with benadryl, methylpred. Troponins with modest peak at 0.30 --> 0.26, BNP 1100, LA 2.6 --> 1.2. Continue to trend. EKG with no acute S1Q3T3 concerns, or changes. BLE venous dopplers pending.  10/4: CTA with significant bilateral PA clot burden. L popliteal thrombus on ulstrasound. Renal artery duplex pending. Troponin trending down. BNP remains elevated with RV strain.  10/5: thrombectomy and tPA catheter placed by IR yesterday PM. Tolerated well. Continue heparin gtt, DOAC prior to dc. IR return today after pretreatment. Will need TTE in 3 months, will probably perform over the weekend to monitor acute RV strain. Troponins normalizing - likely elevated from RV strain. Continue CHF medications.  10/6: excellent pulm emboli burden clearance at IR yesterday. Needs DOAC start. Remains on heparin gtt. Ecchymosis at groin site. Will obtain US to r/o pseudoaneurysm.  Respiratory:  Concern for PE, progressive dyspnea, acute hypoxic respiratory failure, dyspnea on exertion  Tolerating 4L O2 via NAC since admission for SpO2 > 92%. May need to send to CTA sooner if oxygen demands increase.  10/4: Definite PE on CTA. Investigating source. Tolerating 4L NAC, one desat in CT  10/5: No changes. Thrombectomy/thrombolysis yesterday as above.  10/6: Excellent response to thrombolysis  GI: No acute concerns  Continue bowel regimen, anti-emetics. NPO for procedures as needed. Cardiac diet. Check LFTs.  Heme: Concern for PE, need for anticoagulation/antiplatelet  Heparin gtt started. Continue home ASA at lower dose. No significant derangements in hematopoetic cell lines.   10/5: Needs DOAC prior to  dc. Thrombus seen left popliteal and posterior tibial on [place].  10/6: start Eliquis today  ID: Hx uterine ca s/p hysterectomy, leukocytosis  Mild leukocytosis, no left shift. Likely reactive. Concern for etiology of possible PE with cancer hx. Pt also has significant activity restrictions 2/2 comorbidities.  Renal: Renal artery stenosis, DM2, hx AKI      Pt with chronic severe hypertension. MRI 07/2016 showed bilateral renal stenosis (moderate on right and moderate to severe on left). Also concerns for stenosis of major aortic trunks - difficult to visualize. May discuss with CV1 and add renal artery duplex +/- aortic runoff to CTA. Cr at baseline (0.87) 10/4: f/u renal artery stenosis  10/5: Renal artery duplex shows no critical stenoses  FEN: Hypomagnesemia, DM2  Electrolyte goals while in ICU:  Mg >2.0, iCal > 1.0, K+ >4.0 mEq/L.  Insulin per Modified Yale Protocol as needed. Check TSH - elevated in 10/2016. Continue home levothyroxine.  Activity: Bedrest  ???  Dispo:  transition to tele today  ???  Lines:  PIV x 2  Urinary Catheter:  No  ABX: no recent antibiotics  DVT PPx: Heparin  GI PPx: none needed - tolerating cardiac diet PO  _____________________________________________________________________________    Subjective:  [name] is a 81 y.o. female.      Overnight Events: No new events noted.      Objective:  Medications:  Scheduled Meds:    amLODIPine (NORVASC) tablet 10 mg 10 mg Oral QDAY   aspirin EC tablet 81 mg 81 mg Oral QDAY   escitalopram oxalate (LEXAPRO) tablet 5 mg 5 mg Oral QDAY   heparin (porcine) BOLUS for continuous inf (bag) [phone] Units 20-40 Units/kg Intravenous As Prescribed   heparin (porcine) BOLUS for continuous inf (bag) 1,612-3,224 Units 20-40 Units/kg Intravenous As Prescribed   insulin aspart U-100 (NOVOLOG FLEXPEN) injection PEN 0-7 Units 0-7 Units Subcutaneous ACHS   levothyroxine (SYNTHROID) tablet 100 mcg 100 mcg Oral QDAY   loratadine (CLARITIN) tablet 10 mg 10 mg Oral QAM8   metoprolol tartrate (LOPRESSOR) tablet 37.5 mg 37.5 mg Oral BID   rosuvastatin (CRESTOR) tablet 20 mg 20 mg Oral QDAY   senna/docusate (SENOKOT-S) tablet 2 tablet 2 tablet Oral BID   Continuous Infusions:  ??? heparin (porcine) 20,000 units/D5W 500 mL infusion (std conc)(premade) 1,451 Units/hr (10/02/17 0721)     PRN and Respiratory Meds:acetaminophen Q6H PRN, benzocaine/menthol Q2H PRN, fentaNYL citrate PF Intra-procedure Med, midazolam Intra-procedure Med, nitroglycerin Q5 MIN PRN                       Vital Signs: Last Filed                  Vital Signs: 24 Hour Range   BP: 131/70 (10/06 0800)  Temp: 36.8 ???C (98.3 ???F) (10/06 0800) Pulse: 87 (10/06 0800)  Respirations: 18 PER MINUTE (10/06 0800)  SpO2: 94 % (10/06 0800)  O2 Delivery: Nasal Cannula (10/06 0800)  BP: (98-162)/(56-95)   Temp:  [36.6 ???C (97.8 ???F)-36.8 ???C (98.3 ???F)]   Pulse:  [73-87]   Respirations:  [10 PER MINUTE-25 PER MINUTE]   SpO2:  [93 %-99 %]   O2 Delivery: Nasal Cannula    Intensity Pain Scale (Self Report): (not recorded) Vitals:    09/30/17 0400 09/30/17 1028 10/01/17 0400   Weight: 80.2 kg (176 lb 12.8 oz) 80.3 kg (177 lb) 80.6 kg (177 lb 11.1 oz)         Critical Care  Vitals:      ICP Monitoring:     PA  Catheter:     Hemodynamics/Oxycalcs:       Intake/Output Summary:  (Last 24 hours)    Intake/Output Summary (Last 24 hours) at 10/02/17 0831  Last data filed at 10/02/17 0800   Gross per 24 hour   Intake           1432.3 ml   Output             1810 ml   Net           -377.7 ml         Physical Exam:    General: no apparent distress and alert & oriented x4  Neurologic:  AOx3. Gait normal. Cranial nerves 2-12 and sensation grossly intact.  HEENT:   NCAT w/o lesions or tenderness   conjunctivae/corneas clear. PERRL, EOM's intact.   supple, symmetrical, trachea midline, no adenopathy, thyroid: not enlarged, symmetric, no tenderness/mass/nodules,  no carotid bruit and no JVD  Heart: regular rate and rhythm, S1, S2 normal, no murmur, click, rub or gallop  -  Lungs: Normal chest wall and respirations. Clear to auscultation.              Abdomen: soft, non-tender. Bowel sounds normal. No masses,  no organomegaly  MSK: negative  Integumentary: no rashes, no ecchymoses, no petechiae, no purpura  Extremities: extremities normal, trace edema    Lab Review:    Results for orders placed or performed during the hospital encounter of 09/29/17 (from the past 48 hour(s))   POC GLUCOSE    Collection Time: 09/30/17 12:02 PM   # # Low-High    Glucose, POC 310 (H) 70 - 100 MG/DL   POC GLUCOSE    Collection Time: 09/30/17  7:35 PM   # # Low-High    Glucose, POC 320 (H) 70 - 100 MG/DL POC GLUCOSE    Collection Time: 09/30/17 11:01 PM   # # Low-High    Glucose, POC 423 (H) 70 - 100 MG/DL   POC GLUCOSE    Collection Time: 10/01/17  1:07 AM   # # Low-High    Glucose, POC 337 (H) 70 - 100 MG/DL   POC GLUCOSE    Collection Time: 10/01/17  4:13 AM   # # Low-High    Glucose, POC 232 (H) 70 - 100 MG/DL   CBC    Collection Time: 10/01/17  4:15 AM   # # Low-High    White Blood Cells 14.8 (H) 4.5 - 11.0 K/UL    RBC 3.37 (L) 4.0 - 5.0 M/UL    Hemoglobin 9.5 (L) 12.0 - 15.0 GM/DL    Hematocrit 82.9 (L) 36 - 45 %    MCV 84.7 80 - 100 FL    MCH 28.1 26 - 34 PG    MCHC 33.2 32.0 - 36.0 G/DL    RDW 56.2 (H) 11 - 15 %    Platelet Count 232 150 - 400 K/UL    MPV 9.1 7 - 11 FL   BASIC METABOLIC PANEL    Collection Time: 10/01/17  4:15 AM   # # Low-High    Sodium 135 (L) 137 - 147 MMOL/L    Potassium 4.0 3.5 - 5.1 MMOL/L    Chloride 104 98 - 110 MMOL/L    CO2 23 21 - 30 MMOL/L    Anion Gap 8 3 - 12    Glucose 237 (H) 70 - 100  MG/DL    Blood Urea Nitrogen 29 (H) 7 - 25 MG/DL    Creatinine 1.61 0.4 - 1.00 MG/DL    Calcium 8.7 8.5 - 09.6 MG/DL    eGFR Non African American >60 >60 mL/min    eGFR African American >60 >60 mL/min   MAGNESIUM    Collection Time: 10/01/17  4:15 AM   # # Low-High    Magnesium 2.2 1.6 - 2.6 mg/dL   POC GLUCOSE    Collection Time: 10/01/17  8:08 AM   # # Low-High    Glucose, POC 231 (H) 70 - 100 MG/DL   CBC    Collection Time: 10/01/17  8:20 AM   # # Low-High    White Blood Cells 14.5 (H) 4.5 - 11.0 K/UL    RBC 3.41 (L) 4.0 - 5.0 M/UL    Hemoglobin 9.6 (L) 12.0 - 15.0 GM/DL    Hematocrit 04.5 (L) 36 - 45 %    MCV 85.0 80 - 100 FL    MCH 28.3 26 - 34 PG    MCHC 33.2 32.0 - 36.0 G/DL    RDW 40.9 (H) 11 - 15 %    Platelet Count 224 150 - 400 K/UL    MPV 9.0 7 - 11 FL   POC GLUCOSE    Collection Time: 10/01/17 11:36 AM   # # Low-High    Glucose, POC 206 (H) 70 - 100 MG/DL   HEMOGLOBIN    Collection Time: 10/01/17  3:25 PM   # # Low-High    Hemoglobin 9.5 (L) 12.0 - 15.0 GM/DL   POC GLUCOSE Collection Time: 10/01/17  5:32 PM   # # Low-High    Glucose, POC 170 (H) 70 - 100 MG/DL   POC GLUCOSE    Collection Time: 10/01/17  9:27 PM   # # Low-High    Glucose, POC 215 (H) 70 - 100 MG/DL   CBC    Collection Time: 10/01/17  9:30 PM   # # Low-High    White Blood Cells 10.9 4.5 - 11.0 K/UL    RBC 3.51 (L) 4.0 - 5.0 M/UL    Hemoglobin 9.9 (L) 12.0 - 15.0 GM/DL    Hematocrit 81.1 (L) 36 - 45 %    MCV 86.7 80 - 100 FL    MCH 28.3 26 - 34 PG    MCHC 32.6 32.0 - 36.0 G/DL    RDW 91.4 (H) 11 - 15 %    Platelet Count 186 150 - 400 K/UL    MPV 9.2 7 - 11 FL   CBC    Collection Time: 10/02/17  2:15 AM   # # Low-High    White Blood Cells 9.8 4.5 - 11.0 K/UL    RBC 3.33 (L) 4.0 - 5.0 M/UL    Hemoglobin 9.6 (L) 12.0 - 15.0 GM/DL    Hematocrit 78.2 (L) 36 - 45 %    MCV 85.5 80 - 100 FL    MCH 28.9 26 - 34 PG    MCHC 33.8 32.0 - 36.0 G/DL    RDW 95.6 (H) 11 - 15 %    Platelet Count 136 (L) 150 - 400 K/UL    MPV 9.1 7 - 11 FL   BASIC METABOLIC PANEL    Collection Time: 10/02/17  2:15 AM   # # Low-High    Sodium 135 (L) 137 - 147 MMOL/L    Potassium 4.7 3.5 - 5.1 MMOL/L  Chloride 105 98 - 110 MMOL/L    CO2 24 21 - 30 MMOL/L    Anion Gap 6 3 - 12    Glucose 448 (H) 70 - 100 MG/DL    Blood Urea Nitrogen 25 7 - 25 MG/DL    Creatinine 0.86 (H) 0.4 - 1.00 MG/DL    Calcium 8.5 8.5 - 57.8 MG/DL    eGFR Non African American 52 (L) >60 mL/min    eGFR African American >60 >60 mL/min   PTT (APTT)    Collection Time: 10/02/17  2:15 AM   # # Low-High    APTT 20.3 20.0 - 36.0 SEC   POC GLUCOSE    Collection Time: 10/02/17  3:23 AM   # # Low-High    Glucose, POC 405 (H) 70 - 100 MG/DL   POC GLUCOSE    Collection Time: 10/02/17  7:34 AM   # # Low-High    Glucose, POC 347 (H) 70 - 100 MG/DL       Point of Care Testing:  (Last 24 hours):  Glucose: (!) 448 (10/02/17 0215)  POC Glucose (Download): (!) 347 (10/02/17 0734)    Radiology and Other Diagnostic Procedures Review:    Reviewed. Significant main PA burden I have seen, examined and reviewed data concerning this patient.  I discussed the findings and plan of care with the ICU team. I spent 41 minutes in critical care time, excluding procedures today.    Charlies Silvers, MD, MS, CCC/SLP 10/02/2017 8:31 AM  Clinical Instructor, Anesthesiology and Critical Care  [phone]

## 2017-10-02 NOTE — Progress Notes
RT Adult Assessment Note    NAME:Katrina Wang             MRN: [id number]             DOB:20-May-1936          AGE: 81 y.o.  ADMISSION DATE: 09/29/2017             DAYS ADMITTED: LOS: 3 days    RT Treatment Plan:       Protocol Plan: Procedures  Oxygen/Humidity: O2 to keep SpO2 > 92%  Monitoring: Pulse oximetry BID & PRN    Additional Comments:  Impressions of the patient: Pt awake/alert in chair.  Intervention(s)/outcome(s): SPO2 95% on 2L NC.  Patient education that was completed: N/A  Recommendations to the care team: None at this time.    Vital Signs:  Pulse: Pulse: 72  RR: Respirations: 15 PER MINUTE  SpO2: SpO2: 95 %  O2 Device: $$ O2 Device: Cannula  Liter Flow: O2 Liter Flow: 2 lpm  O2%:    Breath Sounds:    Respiratory Effort: Respiratory Effort: Non-Labored

## 2017-10-02 NOTE — Progress Notes
Patient to IR with this RN and Diane, IR RN.

## 2017-10-02 NOTE — Progress Notes
Report given to oncoming CICU nurse, Selena Batten, RN at IR bedside.

## 2017-10-02 NOTE — Progress Notes
PHYSICAL THERAPY  ASSESSMENT     MOBILITY:  Mobility  Progressive Mobility Level: Walk in hallway  Distance Walked (feet): 400 ft  Level of Assistance: Assist X1  Assistive Device: Walker  Time Tolerated: 11-30 minutes  Activity Limited By: Fatigue    SUBJECTIVE:  Subjective  Significant hospital events: PmHx of NSTEMI, CAD s/p PCI and CABG, Type II Diabetes mellitus, severe peripheral vascular disease s/p bilateral ilio-femoral bypass graft/stents, HTN, HLD, hypothyroidism, carotid artery disease, uterine cancer s/p hysterectomy who presented to OSH ED after a syncopal episode proceeded by 1 month of progressively worsening shortness of breath. CTPE on 10/4 demonstrated sub-massive PE with large clot burden. S/p PE clot aspiration and placement of thrombolysis catheters 10/4. on 10/5 Pulmonary arteriogram demonstrates resolution of bilateral PEs, lysis catheters and sheaths removed.  Mental / Cognitive Status: Alert;Oriented;Cooperative;Follows Commands  Pain: Patient has no complaint of pain  Pain Interventions: Patient agrees to participate in therapy  Comments: 2 liters of oxygen via nasal canula  Ambulation Assist: Independent Mobility in [employer] without Device  Patient Owned Equipment: Single [employer] Situation: Lives Alone  Type of Home: House  Entry Stairs: No Stairs  In-Home Stairs: 1-2 Flights of Stairs;Rail on 1 Side (1 flight to basement for laundry)  Comments: Patient reports that she was independent with all ADL and IADL prior to admission.     ROM:  ROM  LE ROM: Bilateral;WFL    STRENGTH:  Strength  R LE WNL: Yes  L LE WNL: Yes    POSTURE/NEURO:  Posture / Neurological  Head Control: Independent  Posture: Rounded Shoulders    BED MOBILITY/TRANSFERS:  Bed Mobility/Transfers  Comments: Patient up in chair upon arrival   Transfer Type: Sit to Stand  Transfer: Assistance Level: To/From;Bed Side Chair;Minimal Assist (contact guard assist )  Transfer: Assistive Device: Nurse, adult Transfers: Type Of Assistance: For Balance;For Safety Considerations  End Of Activity Status: Up in Chair;Nursing Notified;Instructed Patient to Request Assist with Mobility;Instructed Patient to Use Call Light (chair alarm activated )    BALANCE:  Balance  Standing Balance: Static Standing Balance;Dynamic Standing Balance;2 UE support;Standby Assist    GAIT:  Gait  Gait Distance: 400 feet  Gait: Assistance Level: Minimal Assist (contact guard assist )  Gait: Assistive Device: Roller Walker  Gait: Descriptors: Pace: Slow;Decreased step length;No balance loss  Comments: Patient ambulated 400 feet with roller walker and contact guard assist. No loss of balance. Heart rate less than or equal to 105 throughout ambulation.   Activity Limited By: Complaint of Fatigue;Patient Choice    ACTIVITY/EXERCISE:  Activity / Exercise  Stand At Bedside : 2 minutes  Stand At Bedside Assist: Stand By Assist    EDUCATION:  Education  Persons Educated: Patient  Patient Barriers To Learning: None Noted  Teaching Methods: Verbal Instruction  Patient Response: Verbalized Understanding  Topics: Plan/Goals of PT Interventions;Use of Assistive Device/Orthosis;Mobility Progression;Safety Awareness;Up with Assist Only;Importance of Increasing Activity;Ambulate With Nursing;Recommend Continued Therapy    ASSESSMENT/PROGRESS:  Assessment/Progress  Impaired Mobility Due To: Decreased Activity Tolerance;Medical Status Limitation  Assessment/Progress: Should Improve w/ Continued PT  AM-PAC 6 Clicks Basic Mobility Inpatient  Turning from your back to your side while in a flat bed without using bed rails: None  Moving from lying on your back to sitting on the side of a flatbed without using bedrails : A Little  Moving to and from a bed to a chair (including a wheelchair): A Little  Standing up from a chair  using your arms (e.g. wheelchair, or bedside chair): A Little  To walk in hospital room: A Little  Climbing 3-5 steps with a railing: A Little Raw Score: 19  Standardized (T-scale) Score: 42.48  Basic Mobility CMS 0-100%: 36.99  CMS G Code Modifier for Basic Mobility: CJ    GOALS:  Goals  Goal Formulation: With Patient  Time For Goal Achievement: 2 days, To, 4 days  Pt Will Go Supine To/From Sit: Independently  Pt Will Transfer Sit to Stand: Independently  Pt Will Ambulate: Greater than 200 Feet, w/ No Device, w/ Stand By Assist  Pt Will Go Up / Down Stairs: 1-2 Flights, w/ Stand By Assist    PLAN:  Plan   Treatment Interventions: Mobility Training;Balance Activities;Endurance Training  Plan Frequency: 5 Days per Week  PT Plan for Next Visit: Continue to increase ambulation distance. Wean from roller walker as able. trial stairs.     RECOMMENDATIONS:  PT Discharge Recommendations  PT Discharge Recommendations: Home;Home Health Setting  Equipment Recommendations: Roller Dan Humphreys (early recommendation )  Patient requires the use of a walker with wheels to complete ADL???s in the home including meal preparation, ambulation the bathroom for toileting, bathing and grooming, and safe home mobility.  Patient is unable to complete these ADL???s with a cane or crutch and can safely use the walker.     Therapist: Marchelle Folks, PT  Date: 10-11-17  G-Codes: Mobility  [phone] Current Status:  20-39% Impairment  [id number] Goal Status:  1-19% Impairment     Based on above evaluation and clinical judgment.

## 2017-10-02 NOTE — Progress Notes
Report called to Annice Pih, RN on [facility]. Patient to transfer to [phone] when bed is clean.

## 2017-10-02 NOTE — Progress Notes
Notified Dr Sandria Manly of BG on BMP, ordered 0300 dose of novolog.

## 2017-10-02 NOTE — Progress Notes
Patient arrived to CICU with IR RN Diane and myself. Patient stable and groin sites stable, color and cap refill noted in chart.

## 2017-10-03 ENCOUNTER — Encounter: Admit: 2017-10-03 | Discharge: 2017-10-03 | Payer: MEDICARE

## 2017-10-03 DIAGNOSIS — I739 Peripheral vascular disease, unspecified: ICD-10-CM

## 2017-10-03 DIAGNOSIS — E785 Hyperlipidemia, unspecified: ICD-10-CM

## 2017-10-03 DIAGNOSIS — I2602 Saddle embolus of pulmonary artery with acute cor pulmonale: ICD-10-CM

## 2017-10-03 DIAGNOSIS — E039 Hypothyroidism, unspecified: ICD-10-CM

## 2017-10-03 DIAGNOSIS — I10 Essential (primary) hypertension: ICD-10-CM

## 2017-10-03 DIAGNOSIS — Z951 Presence of aortocoronary bypass graft: ICD-10-CM

## 2017-10-03 DIAGNOSIS — I2510 Atherosclerotic heart disease of native coronary artery without angina pectoris: ICD-10-CM

## 2017-10-03 DIAGNOSIS — I779 Disorder of arteries and arterioles, unspecified: ICD-10-CM

## 2017-10-03 DIAGNOSIS — E119 Type 2 diabetes mellitus without complications: ICD-10-CM

## 2017-10-03 LAB — POC GLUCOSE
Lab: 361 mg/dL — ABNORMAL HIGH (ref 70–100)
Lab: 197 mg/dL — ABNORMAL HIGH (ref 70–100)
Lab: 218 mg/dL — ABNORMAL HIGH (ref 70–100)
Lab: 199 mg/dL — ABNORMAL HIGH (ref 70–100)

## 2017-10-03 LAB — URINALYSIS MICROSCOPIC REFLEX TO CULTURE

## 2017-10-03 LAB — URINALYSIS DIPSTICK REFLEX TO CULTURE
Lab: NEGATIVE
Lab: NEGATIVE
Lab: POSITIVE — AB

## 2017-10-03 LAB — MAGNESIUM: Lab: 2 mg/dL — ABNORMAL LOW (ref 1.6–2.6)

## 2017-10-03 LAB — BASIC METABOLIC PANEL: Lab: 138 MMOL/L — ABNORMAL LOW (ref 137–147)

## 2017-10-03 LAB — CBC: Lab: 8.4 K/UL — ABNORMAL HIGH (ref >60)

## 2017-10-03 MED ORDER — METOPROLOL TARTRATE 50 MG PO TAB
50 mg | Freq: Two times a day (BID) | ORAL | 0 refills | Status: DC
Start: 2017-10-03 — End: 2017-10-04
  Administered 2017-10-04 (×2): 50 mg via ORAL

## 2017-10-03 MED ORDER — CEFTRIAXONE INJ 1GM IVP
1 g | INTRAVENOUS | 0 refills | Status: DC
Start: 2017-10-03 — End: 2017-10-04
  Administered 2017-10-03: 23:00:00 1 g via INTRAVENOUS

## 2017-10-03 NOTE — Progress Notes
RT Exercise Oximetry Note    NAME:[name]                                                           MRN: [id number]                 DOB:1936/06/03            AGE: 81 y.o.  ADMISSION DATE: 09/29/2017             DAYS ADMITTED: LOS: 4 days      Date performed: 10/03/17  Time performed: 1300  Time walked: 10 minutes    At Rest While Awake Results    Room air at rest while awake:  SpO2 = 91%      With Exercise Results:    Room air SpO2 with exercise, if needed: 89%        Comments: Test performed on room air. Patient walked duration without any stops/breaks.      Therapist: Lara Mulch, RT

## 2017-10-03 NOTE — Progress Notes
Heart Failure Nursing Progress Note    Admission Date: 09/29/2017  LOS: 4 days    Admission Weight: 80.3 kg (177 lb)        Most recent weights (inpatient):   Vitals:    09/30/17 1028 10/01/17 0400 10/03/17 0400   Weight: 80.3 kg (177 lb) 80.6 kg (177 lb 11.1 oz) 81.6 kg (180 lb)     Weight change from previous day: +1.0 kg    Fluid restriction ordered: No    Intake/Output Summary: (Last 24 hours)    Intake/Output Summary (Last 24 hours) at 10/03/17 0454  Last data filed at 10/03/17 0415   Gross per 24 hour   Intake           663.97 ml   Output              460 ml   Net           203.97 ml       Is patient incontinent No    Anticipated discharge date: 10/8?  Discharge goals: TBD      Daily Assessment of Patient Stated Goals:    Short Term Goal Identified by patient (Short Term=during hospitalization):  Get more sleep

## 2017-10-03 NOTE — Progress Notes
Heart Failure Nursing Progress Note    Admission Date: 09/29/2017  LOS: 4 days    Admission Weight: 80.3 kg (177 lb)        Most recent weights (inpatient):   Vitals:    09/30/17 1028 10/01/17 0400 10/03/17 0400   Weight: 80.3 kg (177 lb) 80.6 kg (177 lb 11.1 oz) 81.6 kg (180 lb)     Weight change from previous day: + 1.0 kg     Fluid restriction ordered: No    Intake/Output Summary: (Last 24 hours)    Intake/Output Summary (Last 24 hours) at 10/03/17 1643  Last data filed at 10/03/17 1519   Gross per 24 hour   Intake           900.97 ml   Output              850 ml   Net            50.97 ml       Is patient incontinent No    Anticipated discharge date: 10/7   Discharge goals: To be able to keep up with household tasks      Daily Assessment of Patient Stated Goals:    Short Term Goal Identified by patient (Short Term=during hospitalization):  Go home

## 2017-10-03 NOTE — Discharge Instructions - Pharmacy
Physician Discharge Summary      Name: Katrina Wang  Medical Record Number: [id number]        Account Number:  [id number]  Date Of Birth:  12-05-1936                         Age:  81 years   Admit date:  09/29/2017                     Discharge date:  10/04/2017    Attending Physician:  Lenice Llamas             Service: Cardiology-1st Round/CCU- [id number]    Physician Summary completed by: Luiz Blare Ahmed    Reason for hospitalization: Syncope    Significant PMH:   Past Medical History:   Diagnosis Date   ??? Acute saddle pulmonary embolism with acute cor pulmonale (HCC)    ??? CAD (coronary artery disease) 07/04/2008   ??? CAD (coronary artery disease), native coronary artery 07/04/2008    1999: Complex stent PCI to proximal OMB  03/07/12: EF 60%, 20% ostial left main, 40% prox LAD, 70% mid, 40% distal LAD at previous POBA site; Dominant CFX - 20% prox-CFX, 40% mid-CFX, 30% prox 1st OMB,  50% instent restenosis in 2nd OMB, 40-50% distal 2nd OMB, 40-50% left PDA, small diffusely disease RCA without significant obstruction, per cath by Dr. Vivianne Spence.  No PCI indicated.    ??? Carotid artery disease (HCC) 07/04/2008   ??? Essential hypertension 07/04/2008   ??? H/O two vessel coronary artery bypass graft    ??? HLD (hyperlipidemia) 07/04/2008   ??? HTN (hypertension) 07/04/2008   ??? Hypothyroidism 09/30/2017   ??? PVD (peripheral vascular disease) (HCC) 07/04/2008   ??? Type 2 diabetes mellitus (HCC)           Allergies: Imdur [isosorbide mononitrate]; Iodinated contrast- oral and iv dye; Lisinopril; and Atorvastatin    Admission Physical Exam notable for:    Vital Signs: Last Filed In 24 Hours Vital Signs: 24 Hour Range   BP: 171/99 (10/03 1126)  Temp: 36.4 ???C (97.6 ???F) (10/03 0913)  Pulse: 98 (10/03 0913)  Respirations: 25 PER MINUTE (10/03 0913)  SpO2: 97 % (10/03 0913)  O2 Delivery: Nasal Cannula (10/03 0913)  SpO2 Pulse: 97 (10/03 0913)  Height: 165.1 cm (65) (10/03 1126) BP: (169-171)/(87-99)   Temp:  [36.4 ???C (97.6 ???F)]   Pulse:  [98-100] Respirations:  [15 PER MINUTE-25 PER MINUTE]   SpO2:  [97 %]   O2 Delivery: Nasal Cannula    ???   ???  General: Well nourished, well developed, in no acute distress  Psych: Alert and oriented, engaged, smiling at times  EYE: EOMI. PERRLA. Sclera anicteric, non-injected without discharge. No conjunctival pallor.  ENT: Oral mucosa moist. No oral lesions. Trachea midline. No palpable neck masses  Cardiovascular: Mildly tachycardic to 100s, regular rhythm. S1 S2 present, no S3 or S4. No murmurs or rubs appreciated. Radial, dorsalis pedis, posterior tibial pulses +1/symmetric. Capillary refill < 3 seconds in bilateral upper and lower extremities. No JVD. No hepatojuglar reflex appreciated.  Respiratory: Breathing without use of accessory muscles on oxygen. Lungs clear to auscultation bilaterally with out crackles, wheezes, or rhonchi.  GI: Abdomen soft, non-tender, non-distended. No palpable organomegaly or masses. No rebound. Bowel sounds present throughout abdomen.  MSK/extremities: No cyanosis or clubbing. No gross deformity. Trace non-pitting edema. Superficial veins in legs. No palpable calf cords. No calf tenderness.  No arm swelling.   Neuro:. Grossly normal sensation. Moves all 4 limbs spontaneous without gross neurological deficits.  Skin: Warm, dry. Multiple SKs. No significant bruising or bleeding.  ???        Admission Lab/Radiology studies notable for: Results for BRENT, SHIMIZU (MRN [id number]) as of 10/03/2017 12:52   Ref. Range 09/29/2017 10:21   Hemoglobin Latest Ref Range: 12.0 - 15.0 GM/DL 45.4   Hematocrit Latest Ref Range: 36 - 45 % 40.3   Platelet Count Latest Ref Range: 150 - 400 K/UL 333   White Blood Cells Latest Ref Range: 4.5 - 11.0 K/UL 12.2 (H)   Neutrophils Latest Ref Range: 41 - 77 % 76   Absolute Neutrophil Count Latest Ref Range: 1.8 - 7.0 K/UL 9.20 (H)   Lymphocytes Latest Ref Range: 24 - 44 % 16 (L)   Absolute Lymph Count Latest Ref Range: 1.0 - 4.8 K/UL 1.90 Monocytes Latest Ref Range: 4 - 12 % 6   Absolute Monocyte Count Latest Ref Range: 0 - 0.80 K/UL 0.80   Eosinophils Latest Ref Range: 0 - 5 % 1   Absolute Eosinophil Count Latest Ref Range: 0 - 0.45 K/UL 0.10   Absolute Basophil Count Latest Ref Range: 0 - 0.20 K/UL 0.10   Basophils Latest Ref Range: 0 - 2 % 1   RBC Latest Ref Range: 4.0 - 5.0 M/UL 4.72   MCV Latest Ref Range: 80 - 100 FL 85.3   MCH Latest Ref Range: 26 - 34 PG 28.2   MCHC Latest Ref Range: 32.0 - 36.0 G/DL 09.8   MPV Latest Ref Range: 7 - 11 FL 8.9   RDW Latest Ref Range: 11 - 15 % 15.8 (H)   Sodium Latest Ref Range: 137 - 147 MMOL/L 134 (L)   Potassium Latest Ref Range: 3.5 - 5.1 MMOL/L 3.6   Chloride Latest Ref Range: 98 - 110 MMOL/L 100   CO2 Latest Ref Range: 21 - 30 MMOL/L 22   Anion Gap Latest Ref Range: 3 - 12  12   Blood Urea Nitrogen Latest Ref Range: 7 - 25 MG/DL 15   Creatinine Latest Ref Range: 0.4 - 1.00 MG/DL 1.19   eGFR Non African American Latest Ref Range: >60 mL/min >60   eGFR African American Latest Ref Range: >60 mL/min >60   Glucose Latest Ref Range: 70 - 100 MG/DL 147 (H)   Lactic Acid Latest Ref Range: 0.5 - 2.0 MMOL/L 2.6 (H)   Calcium Latest Ref Range: 8.5 - 10.6 MG/DL 9.9   Magnesium Latest Ref Range: 1.6 - 2.6 mg/dL 1.5 (L)   B Type Natriuretic Peptide Latest Ref Range: 0 - 100 PG/ML 1,113.0 (H)   Troponin-I Latest Ref Range: 0.0 - 0.05 NG/ML 0.30 (H)   Echo:  1.  Normal left ventricular systolic function. EF~ 60%  2. Moderately dilated right ventricle with moderately reduced systolic function.  3. Moderate tricuspid regurgitation  4. No significant pericardial effusion  5. Moderate to severe pulmonary hypertension with estimated peak systolic PA pressure of 73 mmHg (syetemic blood pressure is also elevated (173/99 mm Hg)  ???  Compared to previous study on 10/23/2016, right ventricle is dilated with depressed systolic function. PA systolic pressures could not be assessed on prior study. Brief Hospital Course:  The patient was admitted and the following issues were addressed during this hospitalization: (with pertinent details).    Mrs. Etchison is a 81 year old with a past medical history of NSTEMI, CAD s/p PCI and  CABG, Type II Diabetes mellitus, severe peripheral vascular disease s/p bilateral ilio-femoral bypass graft/stents, HTN, HLD, hypothyroidism, carotid artery disease, uterine cancer s/p hysterectomy who presented to OSH ED after a syncopal episode proceeded by 1 month of progressively worsening shortness of breath. Echocardiogram on 10/2 demonstrated new dilation of right ventricle with decreased systolic function. Heparin was started empirically while waiting for CTPE protocol which she needed to be premedicated with steroids given contrast allergy. CTPE on 10/4 demonstrated sub-massive PE with large clot burden. Pulmonary and interventional radiology were consulted and performed mechanical and chemical thrombolysis on 10/4 with confirmed of clot resolution on 10/5. She had bleeding from her right groin site from procedure that did not have ultrasound evidence of hematoma formation.  Repeat Echocardiogram today showed improvement in RV parameters from before. She will be discharged today on apixaban 10 mg BID through 10/13 evening, after which she will start 5 mg apixaban BID and will likely continue it lifelong.   On discharge she will follow up with Hematology for workup of secondary causes of her pulmonary embolism. She will also follow up with Pulmonology outpatient for workup of air trapping and hyperinflation seen on PFTs during this admission.  Ms. Schnieders was started on ceftriaxone for UTI during this admission, and will continue on Cefpodoxime on discharge for 5 days.       Other conditions addressed/treated during this hospitalization:      CAD w/ hx PCI/CABG and hypertension: Continued PTA rosuvastatin, decreased dose of ASA to 81mg  from 325mg , increased metoprolol to 50 mg BID from 37.5 mg BID because patient had room w/ BP and easier dosing.    Anemia - multifactorial, partially due to 400 mL blood loss during thrombectomy.    Hypomagnesemia - 1.5 on admission, replaced and monitored    Chronic diastolic heart failure - no change in management, BNP elevation likely due to acute right heart strain    Type II diabetes/contrast allergy - had to give high doses steroids for CTA chest to confirm PE. Required extra insulin but effect was temporary.     Hypothyroidism - continued PTA med    Condition at Discharge: Stable    Discharge Diagnoses:       Hospital Problems        Active Problems    * (Principal)Acute saddle pulmonary embolism with acute cor pulmonale (HCC)    CAD (coronary artery disease), native coronary artery    PVD (peripheral vascular disease) (HCC)    Carotid artery disease (HCC)    HLD (hyperlipidemia)    Essential hypertension    NSTEMI Type II Secondary to Acute PE    Acute respiratory failure with hypoxia (HCC)    Hypertensive emergency    Near syncope    Chronic diastolic CHF (congestive heart failure) (HCC)    Type 2 diabetes mellitus (HCC)    S/P CABG (coronary artery bypass graft)    Dyspnea on exertion    Syncope    Renal artery stenosis (HCC)    DVT (deep vein thrombosis) in pregnancy (HCC)    Hypothyroidism       Resolved Problems    RESOLVED: Post-operative nausea and vomiting          Surgical Procedures: Mechanical and chemical thrombolysis of pulmonary embolism    Significant Diagnostic Studies and Procedures: Echo, CTPE, mechanical/chemical thrombolysis of PE    Consults:  Pulmonary and Interventional Radiology    Patient Disposition: Home       Patient instructions/medications:  Activity as Tolerated   It is important to keep increasing your activity level after you leave the hospital.  Moving around can help prevent blood clots, lung infection (pneumonia) and other problems.  Gradually increasing the number of times you are up moving around will help you return to your normal activity level more quickly.  Continue to increase the number of times you are up to the chair and walking daily to return to your normal activity level. Begin to work towards your normal activity level at discharge.     Return Appointment   This appointment is at the [facility] office in [place] at [address].  Their number is [phone].   [place] Provider Laurence Aly [[id number]]    Location [facility]    Appointment date: 10/12/2017    Appointment time: 10:00 AM      Report These Signs and Symptoms   Please contact your doctor if you have any of the following symptoms: temperature higher than 100 degrees F, uncontrolled pain, persistent nausea and/or vomiting, difficulty breathing, chest pain, severe abdominal pain, headache, unable to urinate, unable to have bowel movement or drainage with a foul odor     Questions About Your Stay   For questions or concerns regarding your hospital stay:    - DURING BUSINESS HOURS (8:00 AM - 4:30 PM):    Call [phone] and asked to be transferred to your discharge attending physician.    - AFTER BUSINESS HOURS (4:30 PM - 8:00 AM, on weekends, or holidays):  Call [phone] and ask the operator to page the on-call doctor for the discharge attending physician.   Discharging attending physician: Lenice Llamas [[id number]]      Cardiac Diet   Limiting unhealthy fats and cholesterol is the most important step you can take in reducing your risk for cardiovascular disease.  Unhealthy fats include saturated and trans fats.  Monitor your sodium and cholesterol intake.  Restrict your sodium to 2g (grams) or 2000mg  (milligrams) daily, and your cholesterol to 200mg  daily.    If you have questions regarding your diet at home, you may contact a dietitian at [phone].          Current Discharge Medication List START taking these medications    Details   !! apixaban (ELIQUIS) 5 mg tablet Take two tablets by mouth twice daily.  Qty: 60 tablet, Refills: 0    PRESCRIPTION TYPE:  Normal      !! apixaban (ELIQUIS) 5 mg tablet Take 2  tablets by mouth twice daily for 5 days, then 1 tablet by mouth twice daily.  Qty: 80 tablet, Refills: 0    PRESCRIPTION TYPE:  Normal      cefpodoxime (VANTIN) 100 mg tablet Take one tablet by mouth every 12 hours for 5 days. Take with food.  Qty: 10 tablet, Refills: 0    PRESCRIPTION TYPE:  Normal       !! - Potential duplicate medications found. Please discuss with provider.       CONTINUE these medications which have NOT CHANGED    Details   acetaminophen (TYLENOL) 325 mg tablet Take 2 tablets by mouth every 6 hours as needed.  Refills: 0    PRESCRIPTION TYPE:  OTC  Associated Diagnoses: Coronary artery disease involving native heart, angina presence unspecified, unspecified vessel or lesion type      amLODIPine (NORVASC) 10 mg tablet Take 10 mg by mouth daily.    PRESCRIPTION TYPE:  Historical  Med      aspirin EC 325 mg tablet Take 325 mg by mouth daily. Take with food.    PRESCRIPTION TYPE:  Historical Med      CHOLECALCIFEROL (VITAMIN D3) (VITAMIN D3 PO) Take 1 tablet by mouth daily.    PRESCRIPTION TYPE:  Historical Med      coenzyme Q10(+) 100 mg cap Take 100 mg by mouth.    PRESCRIPTION TYPE:  Historical Med      cranberry conc-ascorbic acid 12,600-20 mg cap Take  by mouth.    PRESCRIPTION TYPE:  Historical Med      CYANOCOBALAMIN (VITAMIN B-12) (VITAMIN B12 PO) Take 1 tablet by mouth daily.    PRESCRIPTION TYPE:  Historical Med      escitalopram oxalate (LEXAPRO) 5 mg tablet Take 5 mg by mouth daily.    PRESCRIPTION TYPE:  Historical Med      glimepiride (AMARYL) 1 mg tablet Take 1 mg by mouth twice daily.    PRESCRIPTION TYPE:  Historical Med      loratadine (CLARITIN) 10 mg tablet Take 10 mg by mouth every morning.    PRESCRIPTION TYPE:  Historical Med metFORMIN (GLUCOPHAGE) 1,000 mg tablet Take 1 tablet by mouth twice daily with meals.  Qty: 180 tablet, Refills: 3    PRESCRIPTION TYPE:  Normal      metoprolol tartrate (LOPRESSOR) 37.5 mg tablet Take 1 tablet by mouth twice daily.  Qty: 180 tablet, Refills: 3    PRESCRIPTION TYPE:  Print  Associated Diagnoses: Coronary artery disease involving native heart, angina presence unspecified, unspecified vessel or lesion type      nitroglycerin (NITROSTAT) 0.4 mg tablet 1 Tab every 5 minutes as needed for Chest Pain (Call Dr. if pain does not resolve after 2 consecutive uses).  Qty: 15 Tab, Refills: 2    PRESCRIPTION TYPE:  Normal  Associated Diagnoses: CAD (coronary artery disease); Abnormal cardiovascular stress test; HLD (hyperlipidemia); PVD (peripheral vascular disease) (HCC); Carotid artery disease (HCC); HTN (hypertension)      ondansetron (ZOFRAN) 4 mg tablet Take 1-2 tablets by mouth every 6 hours as needed.  Refills: 0    PRESCRIPTION TYPE:  No Print      rosuvastatin (CRESTOR) 20 mg tablet Take 1 tablet by mouth daily.  Qty: 90 tablet, Refills: 3    PRESCRIPTION TYPE:  Normal      senna/docusate (SENOKOT-S) 8.6/50 mg tablet Take 2 tablets by mouth twice daily.  Qty: 40 tablet, Refills: 0    PRESCRIPTION TYPE:  Print  Associated Diagnoses: Coronary artery disease involving native heart, angina presence unspecified, unspecified vessel or lesion type      SYNTHROID 100 mcg tablet Take 1 tablet by mouth daily.    PRESCRIPTION TYPE:  Historical Med              Scheduled appointments:    Oct 12, 2017 10:00 AM CDT  Return Patient with Laurence Aly, MD  [facility] at [facility]) [address]  [address]  [place] [id number]  [phone]          Pending items needing follow up:   Repeat Echo for follow up on RV strain in 3 weeks.     Signed:  Myrene Buddy, MBBS  Internal Medicine PGY1  Pager: [phone]       Cc:  NA  Primary Care Physician:  Steva Ready   Verified Referring physicians:  Self, Referral   Additional provider(s):  Staff:  I have personally interviewed and examined the patient on day of discharge, have reviewed the documentation, and agree with the hospital course as mentioned by the CV resident

## 2017-10-04 ENCOUNTER — Encounter: Admit: 2017-10-04 | Discharge: 2017-10-04 | Payer: MEDICARE

## 2017-10-04 ENCOUNTER — Ambulatory Visit: Admit: 2017-09-30 | Discharge: 2017-09-30 | Payer: MEDICARE

## 2017-10-04 ENCOUNTER — Inpatient Hospital Stay: Admit: 2017-10-02 | Discharge: 2017-10-02 | Payer: MEDICARE

## 2017-10-04 ENCOUNTER — Inpatient Hospital Stay: Admit: 2017-09-29 | Discharge: 2017-09-29 | Payer: MEDICARE

## 2017-10-04 ENCOUNTER — Inpatient Hospital Stay: Admit: 2017-10-01 | Discharge: 2017-10-01 | Payer: MEDICARE

## 2017-10-04 ENCOUNTER — Inpatient Hospital Stay: Admit: 2017-10-04 | Discharge: 2017-10-04 | Payer: MEDICARE

## 2017-10-04 ENCOUNTER — Inpatient Hospital Stay: Admit: 2017-09-30 | Discharge: 2017-09-30 | Payer: MEDICARE

## 2017-10-04 DIAGNOSIS — I110 Hypertensive heart disease with heart failure: ICD-10-CM

## 2017-10-04 DIAGNOSIS — Z7982 Long term (current) use of aspirin: ICD-10-CM

## 2017-10-04 DIAGNOSIS — Z90710 Acquired absence of both cervix and uterus: ICD-10-CM

## 2017-10-04 DIAGNOSIS — I5032 Chronic diastolic (congestive) heart failure: ICD-10-CM

## 2017-10-04 DIAGNOSIS — J9601 Acute respiratory failure with hypoxia: ICD-10-CM

## 2017-10-04 DIAGNOSIS — Z66 Do not resuscitate: ICD-10-CM

## 2017-10-04 DIAGNOSIS — I82432 Acute embolism and thrombosis of left popliteal vein: ICD-10-CM

## 2017-10-04 DIAGNOSIS — I361 Nonrheumatic tricuspid (valve) insufficiency: ICD-10-CM

## 2017-10-04 DIAGNOSIS — I701 Atherosclerosis of renal artery: ICD-10-CM

## 2017-10-04 DIAGNOSIS — I21A1 Myocardial infarction type 2: ICD-10-CM

## 2017-10-04 DIAGNOSIS — D72829 Elevated white blood cell count, unspecified: ICD-10-CM

## 2017-10-04 DIAGNOSIS — E1151 Type 2 diabetes mellitus with diabetic peripheral angiopathy without gangrene: ICD-10-CM

## 2017-10-04 DIAGNOSIS — D62 Acute posthemorrhagic anemia: ICD-10-CM

## 2017-10-04 DIAGNOSIS — E876 Hypokalemia: ICD-10-CM

## 2017-10-04 DIAGNOSIS — R05 Cough: ICD-10-CM

## 2017-10-04 DIAGNOSIS — R0602 Shortness of breath: ICD-10-CM

## 2017-10-04 DIAGNOSIS — E785 Hyperlipidemia, unspecified: ICD-10-CM

## 2017-10-04 DIAGNOSIS — I82442 Acute embolism and thrombosis of left tibial vein: ICD-10-CM

## 2017-10-04 DIAGNOSIS — I2602 Saddle embolus of pulmonary artery with acute cor pulmonale: Principal | ICD-10-CM

## 2017-10-04 DIAGNOSIS — I708 Atherosclerosis of other arteries: ICD-10-CM

## 2017-10-04 DIAGNOSIS — Z7984 Long term (current) use of oral hypoglycemic drugs: ICD-10-CM

## 2017-10-04 DIAGNOSIS — I252 Old myocardial infarction: ICD-10-CM

## 2017-10-04 DIAGNOSIS — Z951 Presence of aortocoronary bypass graft: ICD-10-CM

## 2017-10-04 DIAGNOSIS — R55 Syncope and collapse: ICD-10-CM

## 2017-10-04 DIAGNOSIS — Z87891 Personal history of nicotine dependence: ICD-10-CM

## 2017-10-04 DIAGNOSIS — N390 Urinary tract infection, site not specified: ICD-10-CM

## 2017-10-04 DIAGNOSIS — M5432 Sciatica, left side: ICD-10-CM

## 2017-10-04 DIAGNOSIS — E039 Hypothyroidism, unspecified: ICD-10-CM

## 2017-10-04 DIAGNOSIS — F329 Major depressive disorder, single episode, unspecified: ICD-10-CM

## 2017-10-04 DIAGNOSIS — R000 Tachycardia, unspecified: ICD-10-CM

## 2017-10-04 DIAGNOSIS — I6529 Occlusion and stenosis of unspecified carotid artery: ICD-10-CM

## 2017-10-04 DIAGNOSIS — B961 Klebsiella pneumoniae [K. pneumoniae] as the cause of diseases classified elsewhere: ICD-10-CM

## 2017-10-04 DIAGNOSIS — Z95820 Peripheral vascular angioplasty status with implants and grafts: ICD-10-CM

## 2017-10-04 DIAGNOSIS — E1165 Type 2 diabetes mellitus with hyperglycemia: ICD-10-CM

## 2017-10-04 DIAGNOSIS — T380X5A Adverse effect of glucocorticoids and synthetic analogues, initial encounter: ICD-10-CM

## 2017-10-04 DIAGNOSIS — I2510 Atherosclerotic heart disease of native coronary artery without angina pectoris: ICD-10-CM

## 2017-10-04 DIAGNOSIS — Z79899 Other long term (current) drug therapy: ICD-10-CM

## 2017-10-04 DIAGNOSIS — Z955 Presence of coronary angioplasty implant and graft: ICD-10-CM

## 2017-10-04 DIAGNOSIS — G8929 Other chronic pain: ICD-10-CM

## 2017-10-04 DIAGNOSIS — Z91041 Radiographic dye allergy status: ICD-10-CM

## 2017-10-04 DIAGNOSIS — Z8542 Personal history of malignant neoplasm of other parts of uterus: ICD-10-CM

## 2017-10-04 DIAGNOSIS — I161 Hypertensive emergency: ICD-10-CM

## 2017-10-04 LAB — POC GLUCOSE
Lab: 284 mg/dL — ABNORMAL HIGH (ref 70–100)
Lab: 213 mg/dL — ABNORMAL HIGH (ref 70–100)
Lab: 214 mg/dL — ABNORMAL HIGH (ref 70–100)

## 2017-10-04 LAB — CBC: Lab: 8.2 K/UL — ABNORMAL HIGH (ref 4.5–11.0)

## 2017-10-04 LAB — BASIC METABOLIC PANEL: Lab: 139 MMOL/L — ABNORMAL LOW (ref 137–147)

## 2017-10-04 MED ORDER — PERFLUTREN LIPID MICROSPHERES 1.1 MG/ML IV SUSP
1-20 mL | Freq: Once | INTRAVENOUS | 0 refills | Status: CP
Start: 2017-10-04 — End: ?
  Administered 2017-10-04: 18:00:00 2 mL via INTRAVENOUS

## 2017-10-04 MED ORDER — APIXABAN 5 MG PO TAB
5 mg | ORAL_TABLET | Freq: Two times a day (BID) | ORAL | 0 refills | Status: CN
Start: 2017-10-04 — End: ?

## 2017-10-04 MED ORDER — POTASSIUM CHLORIDE 20 MEQ PO TBTQ
40 meq | Freq: Once | ORAL | 0 refills | Status: CP
Start: 2017-10-04 — End: ?
  Administered 2017-10-04: 19:00:00 40 meq via ORAL

## 2017-10-04 MED ORDER — APIXABAN 5 MG PO TAB
5 mg | ORAL_TABLET | Freq: Two times a day (BID) | ORAL | 0 refills | Status: AC
Start: 2017-10-04 — End: ?

## 2017-10-04 MED ORDER — ASPIRIN 81 MG PO TBEC
81 mg | ORAL_TABLET | Freq: Every day | ORAL | 3 refills | Status: CN
Start: 2017-10-04 — End: ?

## 2017-10-04 MED ORDER — APIXABAN 5 MG PO TAB
10 mg | ORAL_TABLET | Freq: Two times a day (BID) | ORAL | 0 refills | Status: AC
Start: 2017-10-04 — End: 2017-10-12
  Filled 2017-10-04 (×2): qty 80, 30d supply, fill #1

## 2017-10-04 MED ORDER — APIXABAN 5 MG PO TAB
ORAL_TABLET | Freq: Two times a day (BID) | 3 refills | Status: CN
Start: 2017-10-04 — End: ?

## 2017-10-04 MED ORDER — CEFPODOXIME 100 MG PO TAB
100 mg | ORAL_TABLET | Freq: Two times a day (BID) | ORAL | 0 refills | 7.00000 days | Status: AC
Start: 2017-10-04 — End: ?
  Filled 2017-10-04 (×2): qty 10, 5d supply, fill #1

## 2017-10-04 MED ORDER — POTASSIUM CHLORIDE 20 MEQ PO TBTQ
40 meq | Freq: Once | ORAL | 0 refills | Status: CP
Start: 2017-10-04 — End: ?
  Administered 2017-10-04: 13:00:00 40 meq via ORAL

## 2017-10-04 MED ORDER — APIXABAN 5 MG PO TAB
10 mg | ORAL_TABLET | Freq: Two times a day (BID) | ORAL | 0 refills | Status: CN
Start: 2017-10-04 — End: ?

## 2017-10-04 MED ORDER — METOPROLOL TARTRATE 50 MG PO TAB
50 mg | ORAL_TABLET | Freq: Two times a day (BID) | ORAL | 3 refills | Status: CN
Start: 2017-10-04 — End: ?

## 2017-10-04 NOTE — Case Management (ED)
Case Management Progress Note    NAME:Katrina Wang                          MRN: [id number]              DOB:[birth year]          AGE: 81 y.o.  ADMISSION DATE: 09/29/2017             DAYS ADMITTED: LOS: 5 days      Todays Date: 10/04/2017    Plan  *discharge home today    Interventions  ? Support   Support: Pt/Family Updates re:POC or DC Plan, Patient Education  ? Info or Referral      ? Discharge Planning      ? Medication Needs   Medication Needs: Co-Pay Check (Eliquis copay check)    *Eliquis copay will be $85 for 90 pills. This is the same as the copay for xarelto.    *Patient was not in her room but this RN CM asked floor RN if she would tell the patient that the copay will be the same and she said she would. Patient stated last week she was okay with the copay for xarelto.     ? Financial      ? Legal      ? Other        Disposition  ? Expected Discharge Date    Expected Discharge Date: 10/04/17  ? Transportation   Does the patient need discharge transport arranged?: No  Transportation Name, Phone and Availability #1: son Harvie Heck [phone]  Does the patient use Medicaid Transportation?: No      ? Discharge Disposition               Durable Medical Equipment     No service has been selected for the patient.      [place] [facility]     No service has been selected for the patient.      [facility]     No service has been selected for the patient.       Dialysis/Infusion     No service has been selected for the patient.        Verdie Shire RN, BSN, MSN  Integrated Nursing Case Manager  Office [phone] M-F 8-5 pm

## 2017-10-04 NOTE — Progress Notes
OCCUPATIONAL THERAPY  ASSESSMENT/DISCONTINUE OT NOTE    Patient Name: Katrina Wang                   Room/Bed: BJ478/29  Admitting Diagnosis: nstemi    Past Medical History:   Diagnosis Date   ??? Acute saddle pulmonary embolism with acute cor pulmonale (HCC)    ??? CAD (coronary artery disease) 07/04/2008   ??? CAD (coronary artery disease), native coronary artery 07/04/2008    1999: Complex stent PCI to proximal OMB  03/07/12: EF 60%, 20% ostial left main, 40% prox LAD, 70% mid, 40% distal LAD at previous POBA site; Dominant CFX - 20% prox-CFX, 40% mid-CFX, 30% prox 1st OMB,  50% instent restenosis in 2nd OMB, 40-50% distal 2nd OMB, 40-50% left PDA, small diffusely disease RCA without significant obstruction, per cath by Dr. Vivianne Spence.  No PCI indicated.    ??? Carotid artery disease (HCC) 07/04/2008   ??? Essential hypertension 07/04/2008   ??? H/O two vessel coronary artery bypass graft    ??? HLD (hyperlipidemia) 07/04/2008   ??? HTN (hypertension) 07/04/2008   ??? Hypothyroidism 09/30/2017   ??? PVD (peripheral vascular disease) (HCC) 07/04/2008   ??? Type 2 diabetes mellitus (HCC)        Mobility  Progressive Mobility Level: Walk in room  Distance Walked (feet): 30 ft  Level of Assistance: Stand by assistance  Assistive Device: None  Time Tolerated: 11-30 minutes  Activity Limited By: No limitations    Subjective  Pertinent Dx per Physician: S/p PE clot aspiration and placement of thrombolysis catheters 10/4. on 10/5 Pulmonary arteriogram demonstrates resolution of bilateral PEs, lysis catheters and sheaths removed.  Precautions: Falls (Patient on room air)  Pain / Complaints: Patient has no c/o pain;Patient agrees to participate in therapy  Comments: Patient states she would like to get up and move around.    Objective  Psychosocial Status: Willing and Cooperative to Participate  Persons Present: Provider (at end of session)    Home Living  Type of Home: House  Home Layout: Performs ADL'S on One Level;Laundry in [employer] / Tub: Tub/Shower Unit  Bathroom Equipment: Buyer, retail: Medical laboratory scientific officer    Prior Function  Level Of Independence: Independent with ADLs and functional transfers;Independent with homemaking w/ ambulation  Lives With: Alone  Receives Help From: Friends;Family (friends and son )    ADL's  Where Assessed: Standing at [employer];In [employer] Assist: Independent  Grooming Deficits: No Assist Needed  Bathing Assist: Modified Independent;Shower (per nursing report)  Bathing Deficits: No Assist Needed  Toileting Assist: Modified Independent  Toileting Deficits: Grab Bar Use;No Assist Needed  Functional Transfer Assist: Modified Independent  Functional Transfer Deficits: No Assist Needed;Toilet Transfer  Comment: Patient able to ambulate in room without device and complete ADLS independently this date.    Activity Tolerance  Endurance: 3/5 Tolerates 25-30 Minutes Exercise w/Multiple Rests  Sitting Balance: 4/5 Moves/Returns Trunkal Midpoint 1-2 Inches in Multiple Planes    Cognition  Overall Cognitive Status: WFL to Adequately Complete Self Care Tasks Safely     Education  Persons Educated: Patient  Barriers To Learning: None Noted  Teaching Methods: Verbal Instruction  Patient Response: Verbalized and Demo Understanding  Topics: Role of OT, Goals for Therapy  Goal Formulation: With Patient    Assessment  Prognosis: Good;w/ Family  Goal Formulation: Patient  No Skilled OT: Independent with ADLs;No Acute OT Goals Identified    AM-PAC 6 Clicks Daily Activity Inpatient  Putting on and taking off regular lower body clothes?: None  Bathing (Including washing, rinsing, drying): None  Toileting, which includes using toilet, bedpan, or urinal: None  Putting on and taking off regular upper body clothing: None  Taking care of personal grooming such as brushing teeth: None  Eating meals?: None  Daily Activity Raw Score: 24  Standardized (t-scale) score: 57.54  CMS 0-100% Score: 0  CMS G Code Modifier: CH Plan   Progress: Discontinue OT  OT Frequency: No Further Treatment    OT Discharge Recommendations  OT Discharge Recommendations: Home with Home Health, Home with family assist  Equipment Recommendations: Patient owns necessary equipment    G-Codes: Self-care  [id number] Current Status: 0% Impairment   G8988 Goal Status:  0% Impairment  G8989 Discharge Status:  0% Impairment    Based on above evaluation and clinical judgment.        Therapist: Georgena Spurling OTR/L [id number]  Date: 10/04/2017

## 2017-10-04 NOTE — Progress Notes
Gave pt her discharge instructions- pt verbalized understanding.  Asa 325mg  ordered, clarified with MD pt to take Asa 81mg  daily at home.  Pt verbalized understanding.  IV dc'd cannula intact, telemetry dc'd.

## 2017-10-04 NOTE — Patient Education
Pharmacy Anticoagulation Teaching    Katrina Wang was provided with both verbal and written drug information about Apixaban. Discussion with the patient included: the medication regimen, dosing, monitoring, possible adverse effects, food/drug interactions to be aware of and OTC/herbal medication use. Emphasis was placed on the importance of medication compliance. The patient was also encouraged to contact the pharmacist with any further questions.    Also discussed that dose will decrease from 10mg  bid to 5mg  bid on 10/10/17.    Katrina Wang, [facility]  10/04/2017

## 2017-10-04 NOTE — Progress Notes
Heart Failure Nursing Progress Note    Admission Date: 09/29/2017  LOS: 5 days    Admission Weight: 80.3 kg (177 lb)        Most recent weights (inpatient):   Vitals:    10/01/17 0400 10/03/17 0400 10/04/17 0541   Weight: 80.6 kg (177 lb 11.1 oz) 81.6 kg (180 lb) 80.2 kg (176 lb 12.8 oz)     Weight change from previous day:  -1.4  kg     Fluid restriction ordered:  No    Intake/Output Summary: (Last 24 hours)    Intake/Output Summary (Last 24 hours) at 10/04/17 0541  Last data filed at 10/04/17 0535   Gross per 24 hour   Intake              920 ml   Output             2700 ml   Net            -1780 ml       Is patient incontinent:  No    Anticipated discharge date:  10/8   Discharge goals:  To be able to keep up with household tasks while living independently      Daily Assessment of Patient Stated Goals:    Short Term Goal Identified by patient (Short Term=during hospitalization):  Have echo performed prior to discharge

## 2017-10-04 NOTE — Progress Notes
Pulmonary Consult Note    Name:  TORRA PALA   HYQMV'H Date:  10/04/2017  Admission Date: 09/29/2017  LOS: 5 days                       Assessment/Plan:    81 year old female with PMH as below who presents with dyspnea on exertion and syncope  ???  Problem List  #Sub-Massive PE  #CAD - s/p CABG 2017  #HTN, HPL  #DM2  #Carotid Artery Stenosis - s/p CEA  #PVD - s/p bilateral ilio-femoral bypass graft/stents  ???-Sub-massive PE - in the setting of RV strain, elevated BNP and elevated troponin with high risk features including recent syncopal episode. s/p IR mechanical and chemical thrombectomy. Tolerated the procedure well, with only some groin access site bleeding.   -Lower extremity duplex ultrasound significant for incompletely occlusive thrombus in L popliteal and L posterior tibial veins.  ???  Recommendations  - Repeat TTE in 3 months to monitor RV function, if any abnormalities would have pt follow up with pulmonology. Still would like to have pt follow up with pulm near her hometown to evaluate her air trapping and hyperinflation on PFTs. She does not want to drive to [facility] if possible. So would recommend primary team look into setting up pulm outpt appointment closer to her hometown.   - continue anticoagulation per primary team and consider hypercoagulability work up   ???  Please call the team with any questions or concerns.   ________________________________________________________________________    Subjective  Katrina Wang is a 81 y.o. female.  Patient is doing well, walking around, and breathing feels at baseline. She denies lightheadedness/dizziness, chest pain, palpitations, hemoptysis, leg swelling, Fevers, chills, rashes, bleeding from groin site.       Medications  Scheduled Meds:    amLODIPine (NORVASC) tablet 10 mg 10 mg Oral QDAY   apixaban (ELIQUIS) tablet 10 mg 10 mg Oral BID   [START ON 10/10/2017] apixaban (ELIQUIS) tablet 5 mg 5 mg Oral BID   aspirin EC tablet 81 mg 81 mg Oral QDAY cefTRIAXone (ROCEPHIN) IVP 1 g 1 g Intravenous Q24H*   escitalopram oxalate (LEXAPRO) tablet 5 mg 5 mg Oral QDAY   insulin aspart U-100 (NOVOLOG FLEXPEN) injection PEN 0-14 Units 0-14 Units Subcutaneous ACHS   levothyroxine (SYNTHROID) tablet 100 mcg 100 mcg Oral QDAY   loratadine (CLARITIN) tablet 10 mg 10 mg Oral QAM8   metoprolol tartrate (LOPRESSOR) tablet 50 mg 50 mg Oral BID   rosuvastatin (CRESTOR) tablet 20 mg 20 mg Oral QDAY   senna/docusate (SENOKOT-S) tablet 2 tablet 2 tablet Oral BID   Continuous Infusions:    PRN and Respiratory Meds:acetaminophen Q6H PRN, benzocaine/menthol Q2H PRN, fentaNYL citrate PF Intra-procedure Med, midazolam Intra-procedure Med, nitroglycerin Q5 MIN PRN      Review of Systems:  ROS negative for CP, dyspnea, hemoptysis    Objective:                          Vital Signs: Last Filed                 Vital Signs: 24 Hour Range   BP: 107/54 (10/08 1054)  Temp: 36.8 ???C (98.3 ???F) (10/08 1054)  Pulse: 78 (10/08 1054)  Respirations: 16 PER MINUTE (10/08 1054)  SpO2: 95 % (10/08 1054)  O2 Delivery: None (Room Air) (10/08 1054) BP: (107-156)/(54-63)   Temp:  [36.7 ???C (98.1 ???F)-37.2 ???  C (98.9 ???F)]   Pulse:  [67-96]   Respirations:  [16 PER MINUTE-22 PER MINUTE]   SpO2:  [91 %-96 %]   O2 Delivery: None (Room Air)     Vitals:    10/01/17 0400 10/03/17 0400 10/04/17 0541   Weight: 80.6 kg (177 lb 11.1 oz) 81.6 kg (180 lb) 80.2 kg (176 lb 12.8 oz)       Intake/Output Summary:  (Last 24 hours)    Intake/Output Summary (Last 24 hours) at 10/04/17 1129  Last data filed at 10/04/17 1054   Gross per 24 hour   Intake              680 ml   Output             2750 ml   Net            -2070 ml      Stool Occurrence: 1    Physical Exam  General: Awake alert, well-appearing, no acute distress  HEENT: Atraumatic, normocephalic, EOMI, neck supple, no JVD  CV: RRR, no m/r/g, no LE edema  RESP: Lungs CTAB, no wheezes/rhonchi/rales  GI: Abdomen soft, nontender, non distended, +BS MUSK: No focal pinpoint tenderness, joints ROM grossly in tact  Neuro: CN II-XII grossly in tact, no focal deficits  Derm: no rashes, lesions, abnormal pigmentation  ???  Lab Review  24-hour labs:    Results for orders placed or performed during the hospital encounter of 09/29/17 (from the past 24 hour(s))   POC GLUCOSE    Collection Time: 10/03/17 12:11 PM   Result Value Ref Range    Glucose, POC 218 (H) 70 - 100 MG/DL   POC GLUCOSE    Collection Time: 10/03/17  5:12 PM   Result Value Ref Range    Glucose, POC 199 (H) 70 - 100 MG/DL   POC GLUCOSE    Collection Time: 10/03/17  8:57 PM   Result Value Ref Range    Glucose, POC 284 (H) 70 - 100 MG/DL   BASIC METABOLIC PANEL    Collection Time: 10/04/17  4:01 AM   Result Value Ref Range    Sodium 139 137 - 147 MMOL/L    Potassium 3.7 3.5 - 5.1 MMOL/L    Chloride 105 98 - 110 MMOL/L    CO2 26 21 - 30 MMOL/L    Anion Gap 8 3 - 12    Glucose 179 (H) 70 - 100 MG/DL    Blood Urea Nitrogen 19 7 - 25 MG/DL    Creatinine 8.11 0.4 - 1.00 MG/DL    Calcium 8.8 8.5 - 91.4 MG/DL    eGFR Non African American >60 >60 mL/min    eGFR African American >60 >60 mL/min   CBC    Collection Time: 10/04/17  4:01 AM   Result Value Ref Range    White Blood Cells 8.2 4.5 - 11.0 K/UL    RBC 3.28 (L) 4.0 - 5.0 M/UL    Hemoglobin 9.5 (L) 12.0 - 15.0 GM/DL    Hematocrit 78.2 (L) 36 - 45 %    MCV 87.6 80 - 100 FL    MCH 28.9 26 - 34 PG    MCHC 33.0 32.0 - 36.0 G/DL    RDW 95.6 (H) 11 - 15 %    Platelet Count 181 150 - 400 K/UL    MPV 9.8 7 - 11 FL   POC GLUCOSE    Collection Time: 10/04/17  8:12 AM  Result Value Ref Range    Glucose, POC 213 (H) 70 - 100 MG/DL       Point of Care Testing  (Last 24 hours)  Glucose: (!) 179 (10/04/17 0401)  POC Glucose (Download): (!) 213 (10/04/17 9604)    Radiology and other Diagnostics Review:    Pertinent radiology reviewed.    Cathie Beams   Pulmonology Critical Care, PGY 4  Page at [phone]  10/04/2017, 11:29 AM

## 2017-10-05 ENCOUNTER — Encounter: Admit: 2017-10-05 | Discharge: 2017-10-05 | Payer: MEDICARE

## 2017-10-05 DIAGNOSIS — R69 Illness, unspecified: Principal | ICD-10-CM

## 2017-10-05 DIAGNOSIS — J984 Other disorders of lung: Principal | ICD-10-CM

## 2017-10-05 LAB — CULTURE-URINE W/SENSITIVITY
Lab: >10
Lab: >10 — AB

## 2017-10-07 ENCOUNTER — Encounter: Admit: 2017-10-07 | Discharge: 2017-10-07 | Payer: MEDICARE

## 2017-10-07 DIAGNOSIS — R0989 Other specified symptoms and signs involving the circulatory and respiratory systems: Principal | ICD-10-CM

## 2017-10-07 DIAGNOSIS — I2602 Saddle embolus of pulmonary artery with acute cor pulmonale: ICD-10-CM

## 2017-10-07 DIAGNOSIS — Z86711 Personal history of pulmonary embolism: Principal | ICD-10-CM

## 2017-10-07 NOTE — Telephone Encounter
Need to order PFTS

## 2017-10-12 ENCOUNTER — Ambulatory Visit: Admit: 2017-10-12 | Discharge: 2017-10-13 | Payer: MEDICARE

## 2017-10-12 ENCOUNTER — Encounter: Admit: 2017-10-12 | Discharge: 2017-10-12 | Payer: MEDICARE

## 2017-10-12 DIAGNOSIS — I272 Pulmonary hypertension, unspecified: ICD-10-CM

## 2017-10-12 DIAGNOSIS — I2699 Other pulmonary embolism without acute cor pulmonale: Principal | ICD-10-CM

## 2017-10-12 NOTE — Progress Notes
Date of Service: 10/12/2017    Katrina Wang is a 81 y.o. female.       HPI     I saw Adrain Butrick today in followup of her recent pulmonary embolism.  She did not apparently have a demonstrable deep venous thrombosis at the time, but she does have a history of deep venous thrombosis during pregnancy.  She was discharged at that time on anticoagulation therapy and some right heart strain.  I am going to repeat her echocardiogram in 6 weeks to see if her pulmonary pressures have returned entirely to normal.  She is likely a hypercoagulable state individual without a clearcut diagnosis.  We are going to have her see Hematology for their evaluation as soon as feasible.  Otherwise, we will continue her anticoagulant therapy.     Otherwise, she is not complaining of orthopnea, PND, edema, palpitations, lightheadedness, syncope, or chest pain.    ([id number])             Vitals:    10/12/17 1023   BP: 136/72   Pulse: 71   Weight: 79.8 kg (176 lb)   Height: 1.651 m (5' 5)     Body mass index is 29.29 kg/m???.     Past Medical History  Patient Active Problem List    Diagnosis Date Noted   ??? Acute saddle pulmonary embolism with acute cor pulmonale (HCC) 09/30/2017   ??? DVT (deep vein thrombosis) in pregnancy (HCC) 09/30/2017   ??? Hypothyroidism 09/30/2017   ??? Syncope 09/29/2017   ??? Atherosclerosis of native artery of extremity with intermittent claudication (HCC) 09/29/2017   ??? Occlusion of carotid artery 09/29/2017   ??? Depression, controlled 09/29/2017   ??? 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 Sciatica of left side 09/29/2017   ??? Renal artery stenosis (HCC) 09/29/2017   ??? Dyspnea on exertion 09/28/2017   ??? Sinus tachycardia 09/28/2017   ??? S/P CABG (coronary artery bypass graft) 11/25/2016   ??? Type 2 diabetes mellitus (HCC)    ??? Chronic diastolic CHF (congestive heart failure) (HCC) 10/16/2016   ??? H/O vein stripping 10/15/2016   ??? Hypertensive emergency 08/26/2016 ??? Near syncope 08/26/2016   ??? AKI (acute kidney injury) (HCC) 03/07/2012   ??? NSTEMI Type II Secondary to Acute PE 03/05/2012   ??? Acute respiratory failure with hypoxia (HCC) 03/05/2012   ??? CAD (coronary artery disease), native coronary artery 07/04/2008     1999: Complex stent PCI to proximal OMB    03/07/12: EF 60%, 20% ostial left main, 40% prox LAD, 70% mid, 40% distal LAD at previous POBA site; Dominant CFX - 20% prox-CFX, 40% mid-CFX, 30% prox 1st OMB,  50% instent restenosis in 2nd OMB, 40-50% distal 2nd OMB, 40-50% left PDA, small diffusely disease RCA without significant obstruction, per cath by Dr. Vivianne Spence.  No PCI indicated.     ??? PVD (peripheral vascular disease) (HCC) 07/04/2008     Hx bilateral iliac stents     ??? Carotid artery disease (HCC) 07/04/2008     Hx left carotid endarterectomy     ??? HLD (hyperlipidemia) 07/04/2008   ??? Essential hypertension 07/04/2008         Review of Systems   Constitution: Positive for weakness.   HENT: Negative.    Eyes: Negative.    Cardiovascular: Negative.    Respiratory: Negative.    Endocrine: Negative.    Hematologic/Lymphatic: Negative.    Skin: Negative.  Musculoskeletal: Negative.    Gastrointestinal: Negative.    Genitourinary: Negative.    Psychiatric/Behavioral: Negative.    Allergic/Immunologic: Negative.        Physical Exam  Examination reveals a 81 year old lady in no acute distress.  Her head and neck exam is benign.  Ears, eyes, nose, and throat exam is negative.  She has no peripheral edema, cyanosis, or clubbing.  Lung sounds are clear without rhonchi, rales, or wheezes.  There is no jugular venous distention, thyromegaly, or carotid bruits.  Heart tones are regular without a pathologic murmur or gallop.  Abdomen is benign.  Affect is normal.  Gait is normal.  Muscle strength is normal.  Skin exam is negative.    ([id number])        Cardiovascular Studies  A 12-lead electrocardiogram today shows normal sinus rhythm with nonspecific ST-T wave abnormalities, consistent with previous pulmonary embolism.    ([id number])        Problems Addressed Today  No diagnosis found.    Assessment and Plan              Current Medications (including today's revisions)  ??? acetaminophen (TYLENOL) 325 mg tablet Take 2 tablets by mouth every 6 hours as needed.   ??? amLODIPine (NORVASC) 10 mg tablet Take 10 mg by mouth daily.   ??? apixaban (ELIQUIS) 5 mg tablet Take two tablets by mouth twice daily.   ??? apixaban (ELIQUIS) 5 mg tablet Take 2  tablets by mouth twice daily for 5 days, then 1 tablet by mouth twice daily.   ??? aspirin EC 325 mg tablet Take 325 mg by mouth daily. Take with food.   ??? CHOLECALCIFEROL (VITAMIN D3) (VITAMIN D3 PO) Take 1 tablet by mouth daily.   ??? coenzyme Q10(+) 100 mg cap Take 100 mg by mouth.   ??? cranberry conc-ascorbic acid 12,600-20 mg cap Take  by mouth.   ??? CYANOCOBALAMIN (VITAMIN B-12) (VITAMIN B12 PO) Take 1 tablet by mouth daily.   ??? escitalopram oxalate (LEXAPRO) 5 mg tablet Take 5 mg by mouth daily.   ??? glimepiride (AMARYL) 1 mg tablet Take 1 mg by mouth twice daily.   ??? loratadine (CLARITIN) 10 mg tablet Take 10 mg by mouth every morning.   ??? metFORMIN (GLUCOPHAGE) 1,000 mg tablet Take 1 tablet by mouth twice daily with meals.   ??? metoprolol tartrate (LOPRESSOR) 37.5 mg tablet Take 1 tablet by mouth twice daily.   ??? nitroglycerin (NITROSTAT) 0.4 mg tablet 1 Tab every 5 minutes as needed for Chest Pain (Call Dr. if pain does not resolve after 2 consecutive uses).   ??? ondansetron (ZOFRAN) 4 mg tablet Take 1-2 tablets by mouth every 6 hours as needed.   ??? rosuvastatin (CRESTOR) 20 mg tablet Take 1 tablet by mouth daily.   ??? senna/docusate (SENOKOT-S) 8.6/50 mg tablet Take 2 tablets by mouth twice daily.   ??? SYNTHROID 100 mcg tablet Take 1 tablet by mouth daily.

## 2017-10-12 NOTE — Progress Notes
Referral faxed to hematology for evaluation of hypercoag state and PE.

## 2017-11-15 LAB — LACTIC ACID(LACTATE): Lab: 1.6 — ABNORMAL LOW (ref 37.0–47.0)

## 2017-11-15 LAB — CBC: Lab: 9.8

## 2017-11-15 LAB — COMPREHENSIVE METABOLIC PANEL: Lab: 132 — ABNORMAL LOW (ref 136–145)

## 2017-11-15 LAB — TROPONIN-I

## 2017-11-17 LAB — BASIC METABOLIC PANEL: Lab: 134 — ABNORMAL LOW (ref 136–145)

## 2017-12-07 ENCOUNTER — Ambulatory Visit: Admit: 2017-12-07 | Discharge: 2017-12-08 | Payer: MEDICARE

## 2017-12-07 DIAGNOSIS — I2690 Septic pulmonary embolism without acute cor pulmonale: Principal | ICD-10-CM

## 2017-12-08 ENCOUNTER — Encounter: Admit: 2017-12-08 | Discharge: 2017-12-08 | Payer: MEDICARE

## 2017-12-08 DIAGNOSIS — I2690 Septic pulmonary embolism without acute cor pulmonale: Principal | ICD-10-CM

## 2017-12-14 ENCOUNTER — Ambulatory Visit: Admit: 2017-12-14 | Discharge: 2017-12-15 | Payer: MEDICARE

## 2017-12-14 ENCOUNTER — Encounter: Admit: 2017-12-14 | Discharge: 2017-12-14 | Payer: MEDICARE

## 2017-12-14 DIAGNOSIS — I739 Peripheral vascular disease, unspecified: ICD-10-CM

## 2017-12-14 DIAGNOSIS — I6529 Occlusion and stenosis of unspecified carotid artery: ICD-10-CM

## 2017-12-14 DIAGNOSIS — I2510 Atherosclerotic heart disease of native coronary artery without angina pectoris: Secondary | ICD-10-CM

## 2017-12-14 DIAGNOSIS — I10 Essential (primary) hypertension: Secondary | ICD-10-CM

## 2017-12-14 DIAGNOSIS — I779 Disorder of arteries and arterioles, unspecified: ICD-10-CM

## 2017-12-14 DIAGNOSIS — Z951 Presence of aortocoronary bypass graft: ICD-10-CM

## 2017-12-14 DIAGNOSIS — E039 Hypothyroidism, unspecified: ICD-10-CM

## 2017-12-14 DIAGNOSIS — E785 Hyperlipidemia, unspecified: ICD-10-CM

## 2017-12-14 DIAGNOSIS — I2602 Saddle embolus of pulmonary artery with acute cor pulmonale: ICD-10-CM

## 2017-12-14 DIAGNOSIS — E119 Type 2 diabetes mellitus without complications: ICD-10-CM

## 2017-12-14 NOTE — Progress Notes
Date of Service: 12/14/2017    Katrina Wang is a 81 y.o. female.       HPI     I saw Katrina Wang today in follow-up of her right heart strain and previous bypass surgery.  She had had a a embolism from an unknown source and was being treated with anticoagulant therapy.    A week ago she was also hospitalized with a viral-like infection is not currently on antibiotics.  Her saddle embolism was in October 2018.  She does have some lightheaded spells but no continuing frank syncope and she has known carotid artery disease and hypothyroidism.  To this she had a diagnosis recently of a positive breast biopsy and she will be seeing oncology regarding breast cancer.    She also has a hematology follow-up pending shortly to decide how long she needs to be on anticoagulation and what the etiology of her hypercoagulable state is wondering if this is not related in someway to her recent cancer diagnosis.         Vitals:    12/14/17 1112 12/14/17 1123   BP: 128/58 138/68   Pulse: 75    Weight: 78.7 kg (173 lb 6.4 oz)    Height: 1.651 m (5' 5)      Body mass index is 28.86 kg/m???.     Past Medical History  Patient Active Problem List    Diagnosis Date Noted   ??? Acute saddle pulmonary embolism with acute cor pulmonale (HCC) 09/30/2017   ??? DVT (deep vein thrombosis) in pregnancy (HCC) 09/30/2017   ??? Hypothyroidism 09/30/2017   ??? Syncope 09/29/2017   ??? Atherosclerosis of native artery of extremity with intermittent claudication (HCC) 09/29/2017   ??? Occlusion of carotid artery 09/29/2017   ??? Depression, controlled 09/29/2017   ??? 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 Sciatica of left side 09/29/2017   ??? Renal artery stenosis (HCC) 09/29/2017   ??? Dyspnea on exertion 09/28/2017   ??? Sinus tachycardia 09/28/2017   ??? S/P CABG (coronary artery bypass graft) 11/25/2016   ??? Type 2 diabetes mellitus (HCC)    ??? Chronic diastolic CHF (congestive heart failure) (HCC) 10/16/2016 ??? H/O vein stripping 10/15/2016   ??? Hypertensive emergency 08/26/2016   ??? Near syncope 08/26/2016   ??? AKI (acute kidney injury) (HCC) 03/07/2012   ??? NSTEMI Type II Secondary to Acute PE 03/05/2012   ??? Acute respiratory failure with hypoxia (HCC) 03/05/2012   ??? CAD (coronary artery disease), native coronary artery 07/04/2008     1999: Complex stent PCI to proximal OMB    03/07/12: EF 60%, 20% ostial left main, 40% prox LAD, 70% mid, 40% distal LAD at previous POBA site; Dominant CFX - 20% prox-CFX, 40% mid-CFX, 30% prox 1st OMB,  50% instent restenosis in 2nd OMB, 40-50% distal 2nd OMB, 40-50% left PDA, small diffusely disease RCA without significant obstruction, per cath by Dr. Vivianne Spence.  No PCI indicated.     ??? PVD (peripheral vascular disease) (HCC) 07/04/2008     Hx bilateral iliac stents     ??? Carotid artery disease (HCC) 07/04/2008     Hx left carotid endarterectomy     ??? HLD (hyperlipidemia) 07/04/2008   ??? Essential hypertension 07/04/2008         Review of Systems   Constitution: Positive for weakness.   HENT: Positive for congestion.    Eyes: Negative.    Cardiovascular: Negative.    Respiratory:  Positive for shortness of breath and snoring.    Endocrine: Negative.    Hematologic/Lymphatic: Negative.    Skin: Negative.    Musculoskeletal: Negative.    Gastrointestinal: Negative.    Genitourinary: Negative.    Psychiatric/Behavioral: Negative.    Allergic/Immunologic: Negative.        Physical Exam  Exam reveals a 89 lady no acute distress and exam is benign lungs are clear heart tones regular she has median sternotomy well-healed carotid endarterectomy scar well-healed on the left no jugular venous distention or thyromegaly.  Abdomen is benign she has no peripheral edema sinus clubbing neurologic ventricles  Exam is benign affect is normal gait is normal muscle strength is normal.    Cardiovascular Studies  Twelve-lead EKG shows normal sinus rhythm with widespread T wave inversion finding has been present on previous echocardiograms.    Problems Addressed Today  No diagnosis found.    Assessment and Plan     In summary this is a 81 year old lady previous bypass surgery subsequently idiopathic pulmonary embolism presumably from a lower extremity being workup for hypercoagulability state.  Her recent breast biopsy positive for cancer of course puts her in the category possibly cancer related hypercoagulability state.  Number she is on anticoagulations therapy will make no changes until she sees hematology will follow her back up probably in 3 months in the spring for her coronary artery disease management while she is on risk factor modification and will continue as planned.         Current Medications (including today's revisions)  ??? acetaminophen (TYLENOL) 325 mg tablet Take 2 tablets by mouth every 6 hours as needed.   ??? amLODIPine (NORVASC) 10 mg tablet Take 10 mg by mouth daily.   ??? amoxicillin (AMOXIL) 500 mg capsule Take 1 capsule by mouth three times daily.   ??? apixaban (ELIQUIS) 5 mg tablet Take 2  tablets by mouth twice daily for 5 days, then 1 tablet by mouth twice daily.   ??? aspirin EC 81 mg tablet Take 81 mg by mouth daily. Take with food.   ??? CHOLECALCIFEROL (VITAMIN D3) (VITAMIN D3 PO) Take 1 tablet by mouth daily.   ??? coenzyme Q10(+) 100 mg cap Take 100 mg by mouth.   ??? cranberry conc-ascorbic acid 12,600-20 mg cap Take  by mouth.   ??? CYANOCOBALAMIN (VITAMIN B-12) (VITAMIN B12 PO) Take 1 tablet by mouth daily.   ??? escitalopram oxalate (LEXAPRO) 5 mg tablet Take 5 mg by mouth daily.   ??? glimepiride (AMARYL) 1 mg tablet Take 1 mg by mouth twice daily.   ??? loratadine (CLARITIN) 10 mg tablet Take 10 mg by mouth every morning.   ??? metFORMIN (GLUCOPHAGE) 1,000 mg tablet Take 1 tablet by mouth twice daily with meals.   ??? metoprolol tartrate (LOPRESSOR) 37.5 mg tablet Take 1 tablet by mouth twice daily.   ??? nitroglycerin (NITROSTAT) 0.4 mg tablet 1 Tab every 5 minutes as needed for Chest Pain (Call Dr. if pain does not resolve after 2 consecutive uses).   ??? ondansetron (ZOFRAN) 4 mg tablet Take 1-2 tablets by mouth every 6 hours as needed.   ??? rosuvastatin (CRESTOR) 20 mg tablet Take 1 tablet by mouth daily.   ??? senna/docusate (SENOKOT-S) 8.6/50 mg tablet Take 2 tablets by mouth twice daily.   ??? SYNTHROID 100 mcg tablet Take 1 tablet by mouth daily.

## 2017-12-15 ENCOUNTER — Encounter: Admit: 2017-12-15 | Discharge: 2017-12-15 | Payer: MEDICARE

## 2017-12-15 NOTE — Telephone Encounter
I have been trying to reach Katrina Wang to schedule her with breast providers for her newly diagnosed right breast cancer.  [facility] today to inform her that I have not been able to reach [place].  I talked with Katrina Wang, and she stated that Katrina Wang decided to obtain her treatment by an oncologist closer to her home.

## 2017-12-16 ENCOUNTER — Encounter: Admit: 2017-12-16 | Discharge: 2017-12-16 | Payer: MEDICARE

## 2017-12-16 NOTE — Telephone Encounter
-----   Message from Renetta Chalkiane Kovich, RN sent at 12/15/2017  5:22 PM CST -----      ----- Message -----  From: Laurence Alyosamond, Thomas L, MD  Sent: 12/12/2017   1:03 PM  To: Renetta Chalkiane Kovich, RN, Huel CoventryPatricia Ferazzi, RN    Looks good follwo up as per my letter.

## 2017-12-16 NOTE — Telephone Encounter
Results and recommendations called to patient

## 2018-03-29 ENCOUNTER — Encounter: Admit: 2018-03-29 | Discharge: 2018-03-29 | Payer: MEDICARE

## 2018-03-29 ENCOUNTER — Ambulatory Visit: Admit: 2018-03-29 | Discharge: 2018-03-30 | Payer: MEDICARE

## 2018-03-29 DIAGNOSIS — I2510 Atherosclerotic heart disease of native coronary artery without angina pectoris: Secondary | ICD-10-CM

## 2018-03-29 DIAGNOSIS — I2602 Saddle embolus of pulmonary artery with acute cor pulmonale: ICD-10-CM

## 2018-03-29 DIAGNOSIS — I10 Essential (primary) hypertension: ICD-10-CM

## 2018-03-29 DIAGNOSIS — E039 Hypothyroidism, unspecified: ICD-10-CM

## 2018-03-29 DIAGNOSIS — Z951 Presence of aortocoronary bypass graft: ICD-10-CM

## 2018-03-29 DIAGNOSIS — I779 Disorder of arteries and arterioles, unspecified: ICD-10-CM

## 2018-03-29 DIAGNOSIS — E119 Type 2 diabetes mellitus without complications: ICD-10-CM

## 2018-03-29 DIAGNOSIS — I739 Peripheral vascular disease, unspecified: ICD-10-CM

## 2018-03-29 DIAGNOSIS — E785 Hyperlipidemia, unspecified: ICD-10-CM

## 2018-03-31 ENCOUNTER — Encounter: Admit: 2018-03-31 | Discharge: 2018-03-31 | Payer: MEDICARE

## 2018-03-31 DIAGNOSIS — Z951 Presence of aortocoronary bypass graft: ICD-10-CM

## 2018-03-31 DIAGNOSIS — I739 Peripheral vascular disease, unspecified: ICD-10-CM

## 2018-03-31 DIAGNOSIS — I2510 Atherosclerotic heart disease of native coronary artery without angina pectoris: ICD-10-CM

## 2018-03-31 DIAGNOSIS — I779 Disorder of arteries and arterioles, unspecified: ICD-10-CM

## 2018-03-31 DIAGNOSIS — E119 Type 2 diabetes mellitus without complications: ICD-10-CM

## 2018-03-31 DIAGNOSIS — E785 Hyperlipidemia, unspecified: ICD-10-CM

## 2018-03-31 DIAGNOSIS — E039 Hypothyroidism, unspecified: ICD-10-CM

## 2018-03-31 DIAGNOSIS — I10 Essential (primary) hypertension: ICD-10-CM

## 2018-03-31 DIAGNOSIS — I2602 Saddle embolus of pulmonary artery with acute cor pulmonale: ICD-10-CM

## 2018-04-01 ENCOUNTER — Encounter: Admit: 2018-04-01 | Discharge: 2018-04-01 | Payer: MEDICARE

## 2018-04-01 DIAGNOSIS — I25811 Atherosclerosis of native coronary artery of transplanted heart without angina pectoris: Principal | ICD-10-CM

## 2018-04-01 DIAGNOSIS — I739 Peripheral vascular disease, unspecified: ICD-10-CM

## 2018-04-01 DIAGNOSIS — I779 Disorder of arteries and arterioles, unspecified: ICD-10-CM

## 2018-11-03 ENCOUNTER — Encounter: Admit: 2018-11-03 | Discharge: 2018-11-03 | Payer: MEDICARE

## 2018-11-09 LAB — COMPREHENSIVE METABOLIC PANEL
Lab: 12
Lab: 15
Lab: 16 — ABNORMAL HIGH (ref 0–14)
Lab: 47
Lab: 48 — ABNORMAL LOW (ref >59)

## 2019-01-12 ENCOUNTER — Encounter: Admit: 2019-01-12 | Discharge: 2019-01-12 | Payer: MEDICARE

## 2019-01-12 ENCOUNTER — Ambulatory Visit: Admit: 2019-01-12 | Discharge: 2019-01-12 | Payer: MEDICARE

## 2019-01-12 DIAGNOSIS — Z951 Presence of aortocoronary bypass graft: Secondary | ICD-10-CM

## 2019-01-12 DIAGNOSIS — I739 Peripheral vascular disease, unspecified: Secondary | ICD-10-CM

## 2019-01-12 DIAGNOSIS — I779 Disorder of arteries and arterioles, unspecified: Secondary | ICD-10-CM

## 2019-01-12 DIAGNOSIS — I10 Essential (primary) hypertension: Secondary | ICD-10-CM

## 2019-01-12 DIAGNOSIS — E119 Type 2 diabetes mellitus without complications: Secondary | ICD-10-CM

## 2019-01-12 DIAGNOSIS — I2602 Saddle embolus of pulmonary artery with acute cor pulmonale: Secondary | ICD-10-CM

## 2019-01-12 DIAGNOSIS — E039 Hypothyroidism, unspecified: Secondary | ICD-10-CM

## 2019-01-12 DIAGNOSIS — I25811 Atherosclerosis of native coronary artery of transplanted heart without angina pectoris: Secondary | ICD-10-CM

## 2019-01-12 DIAGNOSIS — E785 Hyperlipidemia, unspecified: Secondary | ICD-10-CM

## 2019-01-12 DIAGNOSIS — I2510 Atherosclerotic heart disease of native coronary artery without angina pectoris: Principal | ICD-10-CM

## 2019-01-13 ENCOUNTER — Encounter: Admit: 2019-01-13 | Discharge: 2019-01-13 | Payer: MEDICARE

## 2019-01-14 IMAGING — CR CHEST
1 series · 1 of 1 positions shown · non-contrast
Comparison: none

[chest port x-wise]
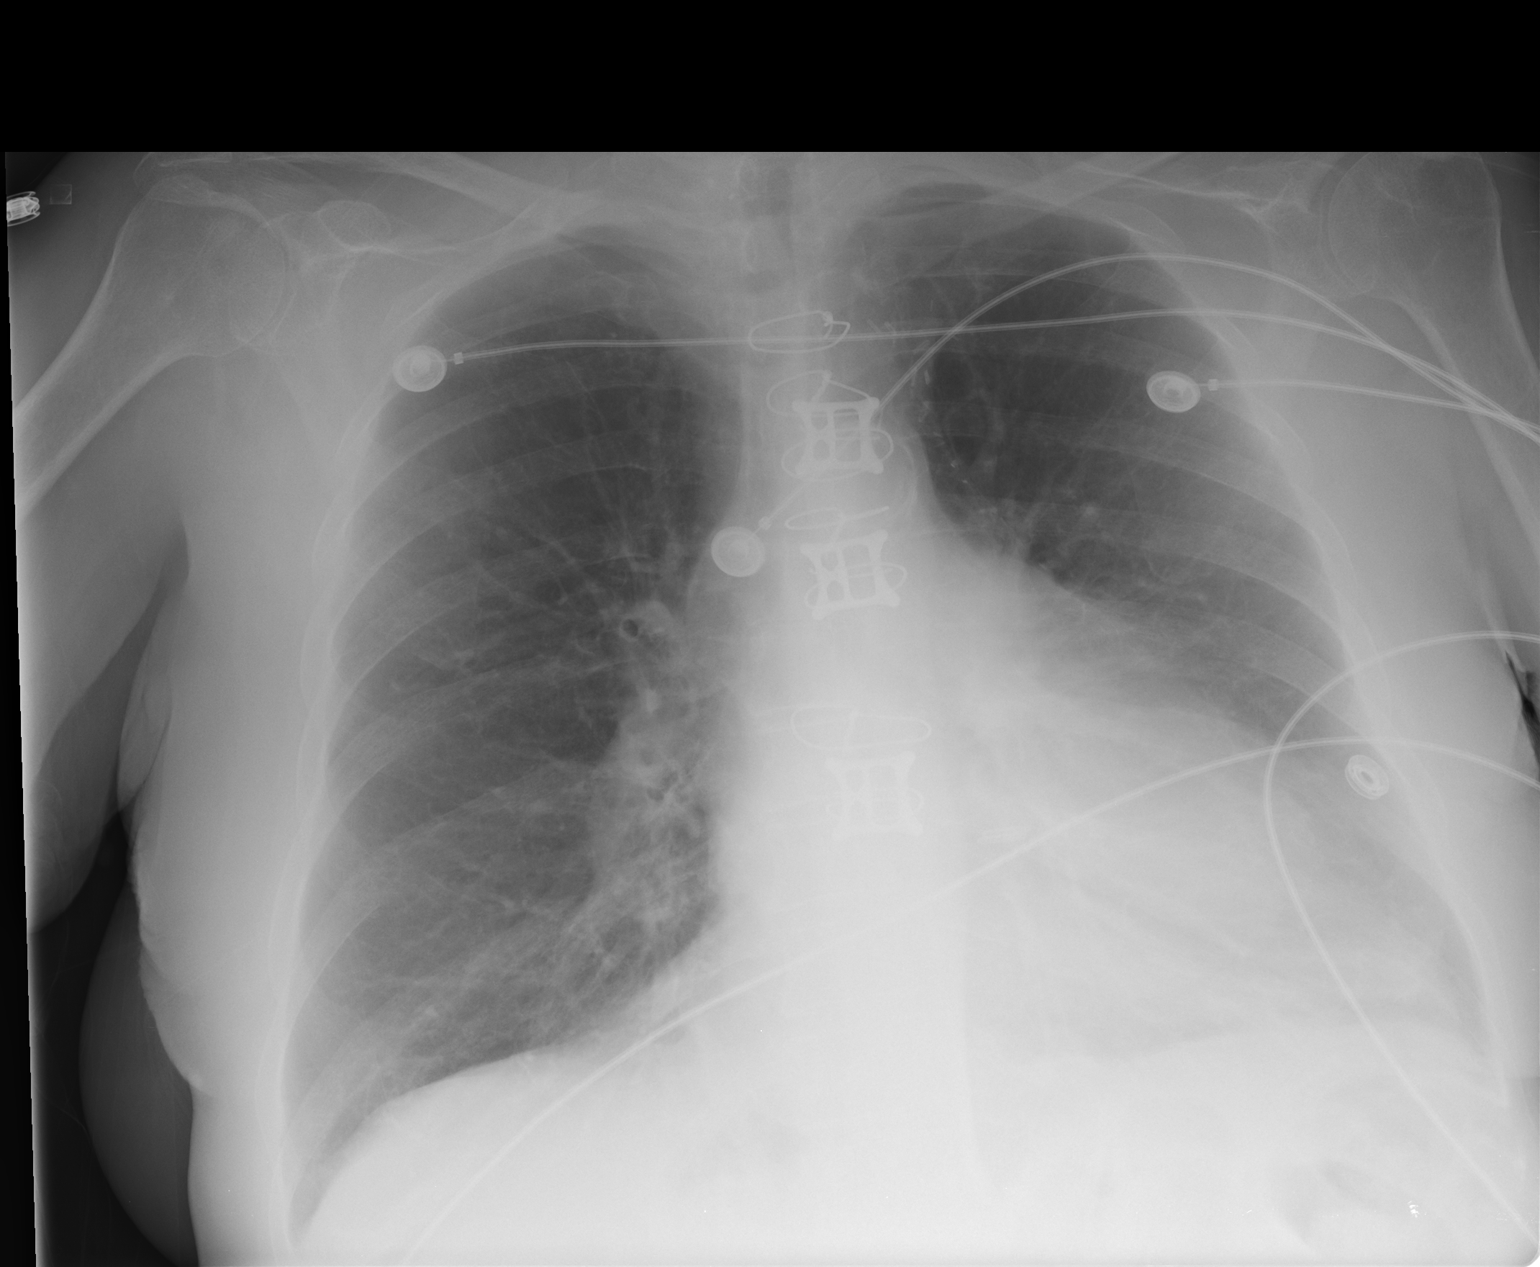

[1 of 1 positions shown; findings below may reference images not displayed]

DIAGNOSTIC STUDIES

EXAM

INDICATION

Chest pain
C/O INCREASING SOA, WORSE W/EXERTION. INTERMITTENT CHEST PAIN. DIZZINESS.
WEAKNESS. H/O CORONARY STENTS, MI, CAD, PVD, HTN. HB

TECHNIQUE

Portable chest compare 10/05/2016

COMPARISONS

FINDINGS

Post median sternotomy. Cardiomegaly. There are chronic lung changes without focal lung
consolidation. Small left basilar atelectasis. No pneumothorax.

IMPRESSION

Chronic lung changes mild left basilar atelectasis. Recommend PA and lateral radiographs when
clinical condition permits. Cardiomegaly.

## 2019-07-11 ENCOUNTER — Encounter: Admit: 2019-07-11 | Discharge: 2019-07-11

## 2019-07-11 NOTE — Telephone Encounter
Attempted to contact patient by phone on 07/11/2019 to schedule follow up appointment with Dr. Fabio Neighbors. No answer on patient's phone. Left message on patient's voicemail to return call to the clinic to schedule appointment.

## 2019-07-18 NOTE — Telephone Encounter
Attempted to reach patient to schedule with TLR. Grier Rocher, RN

## 2020-10-21 ENCOUNTER — Encounter: Admit: 2020-10-21 | Discharge: 2020-10-21 | Payer: MEDICARE

## 2020-10-21 DIAGNOSIS — R079 Chest pain, unspecified: Secondary | ICD-10-CM

## 2020-10-21 MED ORDER — APIXABAN 5 MG PO TAB
5 mg | Freq: Two times a day (BID) | ORAL | 0 refills | Status: DC
Start: 2020-10-21 — End: 2020-10-22

## 2020-10-21 MED ORDER — INSULIN ASPART 100 UNIT/ML SC FLEXPEN
0-6 [IU] | Freq: Before meals | SUBCUTANEOUS | 0 refills | Status: AC
Start: 2020-10-21 — End: ?
  Administered 2020-10-22: 03:00:00 2 [IU] via SUBCUTANEOUS

## 2020-10-21 MED ORDER — HEPARIN (PORCINE) BOLUS FOR CONTINUOUS INF (BAG)
20-40 [IU]/kg | INTRAVENOUS | 0 refills | Status: AC
Start: 2020-10-21 — End: ?

## 2020-10-21 MED ORDER — IPRATROPIUM-ALBUTEROL 0.5 MG-3 MG(2.5 MG BASE)/3 ML IN NEBU
3 mL | RESPIRATORY_TRACT | 0 refills | Status: AC | PRN
Start: 2020-10-21 — End: ?

## 2020-10-21 MED ORDER — PREDNISONE 20 MG PO TAB
40 mg | Freq: Every day | ORAL | 0 refills | Status: AC
Start: 2020-10-21 — End: ?

## 2020-10-21 MED ORDER — ASPIRIN 81 MG PO TBEC
81 mg | Freq: Every day | ORAL | 0 refills | Status: AC
Start: 2020-10-21 — End: ?
  Administered 2020-10-22 – 2020-10-23 (×2): 81 mg via ORAL

## 2020-10-21 MED ORDER — HEPARIN (PORCINE) IN 5 % DEX 20,000 UNIT/500 ML (40 UNIT/ML) IV SOLP
0-2000 [IU]/h | INTRAVENOUS | 0 refills | Status: AC
Start: 2020-10-21 — End: ?
  Administered 2020-10-22 (×2): 907.2 [IU]/h via INTRAVENOUS
  Administered 2020-10-23: 17:00:00 1107 [IU]/h via INTRAVENOUS

## 2020-10-21 MED ORDER — NITROGLYCERIN 0.4 MG SL SUBL
0.4 mg | SUBLINGUAL | 0 refills | Status: AC | PRN
Start: 2020-10-21 — End: ?

## 2020-10-21 MED ORDER — METOPROLOL TARTRATE 25 MG PO TAB
25 mg | Freq: Two times a day (BID) | ORAL | 0 refills | Status: AC
Start: 2020-10-21 — End: ?
  Administered 2020-10-22 (×2): 25 mg via ORAL

## 2020-10-21 MED ORDER — IMS MIXTURE TEMPLATE
100 ug | Freq: Every day | ORAL | 0 refills | Status: AC
Start: 2020-10-21 — End: ?
  Administered 2020-10-22 – 2020-10-29 (×16): 100 ug via ORAL

## 2020-10-21 MED ORDER — AMLODIPINE 10 MG PO TAB
10 mg | Freq: Every day | ORAL | 0 refills | Status: AC
Start: 2020-10-21 — End: ?
  Administered 2020-10-22 – 2020-10-24 (×3): 10 mg via ORAL

## 2020-10-21 MED ORDER — CEFTRIAXONE INJ 1GM IVP
1 g | INTRAVENOUS | 0 refills | Status: AC
Start: 2020-10-21 — End: ?
  Administered 2020-10-22 – 2020-10-23 (×2): 1 g via INTRAVENOUS

## 2020-10-21 MED ORDER — ROSUVASTATIN 20 MG PO TAB
20 mg | Freq: Every day | ORAL | 0 refills | Status: AC
Start: 2020-10-21 — End: ?
  Administered 2020-10-22 – 2020-10-29 (×7): 20 mg via ORAL

## 2020-10-21 MED ORDER — ESCITALOPRAM OXALATE 10 MG PO TAB
20 mg | Freq: Every day | ORAL | 0 refills | Status: AC
Start: 2020-10-21 — End: ?
  Administered 2020-10-22 – 2020-10-29 (×8): 20 mg via ORAL

## 2020-10-21 MED ORDER — ANASTROZOLE 1 MG PO TAB
1 mg | Freq: Every day | ORAL | 0 refills | Status: AC
Start: 2020-10-21 — End: ?
  Administered 2020-10-22 – 2020-10-29 (×8): 1 mg via ORAL

## 2020-10-21 NOTE — Progress Notes
84 yo female pt presented to [facility] with acute onset of soa.  Pt with history of copd and uses inhalers at home with intermittent oxygen.  Pt denies any other symptoms, no chest pain.  Sending MD concerned for copd exacerbation and pt was treated with duoneb and solumedrol.  Pt report symptoms have resolved.  Upon arrival pt was found to have a troponin of 0.037 and the 2 hour repeat was 0.18.  pt does have cardiac history and history of CABG.  EKG negative for any acute changes.  CXR clear.  Pt requesting transfer to [facility] for further workup as she sees cardiology at [facility].

## 2020-10-22 ENCOUNTER — Encounter: Admit: 2020-10-22 | Discharge: 2020-10-22 | Payer: MEDICARE

## 2020-10-22 ENCOUNTER — Inpatient Hospital Stay: Admit: 2020-10-22 | Payer: MEDICARE

## 2020-10-22 ENCOUNTER — Inpatient Hospital Stay: Admit: 2020-10-22 | Discharge: 2020-10-22 | Payer: MEDICARE

## 2020-10-22 DIAGNOSIS — E039 Hypothyroidism, unspecified: Secondary | ICD-10-CM

## 2020-10-22 DIAGNOSIS — I779 Disorder of arteries and arterioles, unspecified: Secondary | ICD-10-CM

## 2020-10-22 DIAGNOSIS — I10 Essential (primary) hypertension: Secondary | ICD-10-CM

## 2020-10-22 DIAGNOSIS — E785 Hyperlipidemia, unspecified: Secondary | ICD-10-CM

## 2020-10-22 DIAGNOSIS — Z951 Presence of aortocoronary bypass graft: Secondary | ICD-10-CM

## 2020-10-22 DIAGNOSIS — I2602 Saddle embolus of pulmonary artery with acute cor pulmonale: Secondary | ICD-10-CM

## 2020-10-22 DIAGNOSIS — I2510 Atherosclerotic heart disease of native coronary artery without angina pectoris: Principal | ICD-10-CM

## 2020-10-22 DIAGNOSIS — E119 Type 2 diabetes mellitus without complications: Secondary | ICD-10-CM

## 2020-10-22 DIAGNOSIS — I739 Peripheral vascular disease, unspecified: Secondary | ICD-10-CM

## 2020-10-22 LAB — COMPREHENSIVE METABOLIC PANEL
Lab: 0.3 mg/dL (ref 0.3–1.2)
Lab: 1 mg/dL — ABNORMAL HIGH (ref 0.4–1.00)
Lab: 10 K/UL — ABNORMAL HIGH (ref 3–12)
Lab: 12 U/L (ref 7–56)
Lab: 136 MMOL/L — ABNORMAL LOW (ref 137–147)
Lab: 19 U/L — ABNORMAL LOW (ref 7–40)
Lab: 20 MMOL/L — ABNORMAL LOW (ref 21–30)
Lab: 22 mg/dL (ref 7–25)
Lab: 263 mg/dL — ABNORMAL HIGH (ref 70–100)
Lab: 3.2 g/dL — ABNORMAL LOW (ref 3.5–5.0)
Lab: 4.5 MMOL/L — ABNORMAL LOW (ref 3.5–5.1)
Lab: 50 mL/min — ABNORMAL LOW (ref >60)
Lab: 53 U/L — ABNORMAL LOW (ref 25–110)
Lab: 6.3 g/dL (ref 6.0–8.0)
Lab: 8.1 mg/dL — ABNORMAL LOW (ref 8.5–10.6)
Lab: >60 mL/min (ref >60)

## 2020-10-22 LAB — TROPONIN-I: Lab: 0.4 ng/mL — ABNORMAL HIGH (ref 0.0–0.05)

## 2020-10-22 LAB — CBC AND DIFF
Lab: 0 10*3/uL (ref 0–0.20)
Lab: 0 10*3/uL (ref 0–0.45)
Lab: 14 10*3/uL — ABNORMAL HIGH (ref 4.5–11.0)

## 2020-10-22 LAB — MAGNESIUM: Lab: 1.8 mg/dL — ABNORMAL LOW (ref 1.6–2.6)

## 2020-10-22 LAB — POC GLUCOSE: Lab: 264 mg/dL — ABNORMAL HIGH (ref 70–100)

## 2020-10-22 MED ADMIN — WATER FOR INJECTION, STERILE IJ SOLN [79513]: 10 mL | INTRAVENOUS | @ 19:00:00 | Stop: 2020-10-22 | NDC 00409488723

## 2020-10-22 NOTE — H&P (View-Only)
Admission History and Physical Examination      Name:  Katrina Wang                                             MRN:  [id number]   Admission Date:  10/21/2020                     Assessment/Plan:        Principal Problem:    Chest pain  Active Problems:    CAD (coronary artery disease), native coronary artery    HLD (hyperlipidemia)    Essential hypertension    Type 2 diabetes mellitus (HCC)    S/P CABG (coronary artery bypass graft)    Hypothyroidism      Katrina Wang is a 84 y.o. female with pmh of Saddle PE, NSTEMI, CAD s/p PCI and CABG, COPD intermittently on supplemental Oxygen, Type II Diabetes mellitus, severe peripheral vascular disease s/p bilateral ilio-femoral bypass graft/stents, HTN, HLD, hypothyroidism, carotid artery disease, uterine cancer s/p hysterectomy who presented to [facility] ED on 10/21/2020 with a chief complaint of shortness of breath, transferred to [facility] for escalation of care and further workup given CAD hx in setting of elevated troponin    Acute on chronic hypoxic respiratory failure  COPD  - Patient has known history of COPD for which she has inhalers and wears about 3L of oxygen at night  - Presented to OSH and there was concern for COPD exacerbation. Was given IV methylprednisolone 125 mg, 1g ceftriaxone, and duoneb treatment prior to transfer  - On presentation to [facility] her lungs sound clear to auscultation. On 3L of oxygen   - CXR from OSH ED was without acute findings  Plan:  > Duonebs q6h prn  > Continue ceftriaxone 1g daily  > Prednisone 40mg  Qday  > Supplemental O2 as needed    Type II NSTEMI  - Patient has significant known CAD hx  - Troponin at initial presentation to OSH was 0.034, increased to 0.177 prior to transfer. On presentation to [facility] was elevated to 0.46  - Received Aspirin 81mg  at OSH  - On Eliquis 5mg  BID PTA  - No acute EKG changes on admit but EKG from OSH ED with possible subtle ST depression in V2-V6  - Patient denies any chest pain both on admit to [facility] and during her time at the [facility] ED  Plan:  > Heparin gtt and will hold eliquis (no initial bolus) given elevated TIMI score (5)  > Trend troponins q6h  > Will maintain NPO status until troponin has peaked in case intervention is required    UTI  - Patient notes she presented to OSH due to vomiting x4 at home, also noted she had chills and extreme fatigue  - UA at OSH ED was significant for Urine WBC's 50-100, 3+ bacteria, 1+ leukocytes  - Did present to [facility] with mild leukocytosis but this was after she received steroids at outside ED. Did not have leukocytosis on initial presentation to ED.  Plan:  > Received 1g ceftriaxone for possible COPD exacerbation. Will continue 1g Ceftriaxone daily  > Repeat UA w/ culture    PVD  - Patient has known significant PVD which has required bilateral ilio-femoral bypass graft/stents  Plan  > Continue PTA aspirin 81mg     DMII  - On metformin 1g BID and  glimeperide 1mg  BID PTA  Plan:  > LDCF while inpatient    HTN  - On amlodipine 10mg  qday, metoprolol 25mg  qday  Plan:  > Continue PTA regimen while inpatient    Depression  - Continue PTA lexapro 20mg     HLD:   - Continue PTA crestor 20mg     Hypothyroidism  - Continue PTA synthroid    Hx uterine cancer  - Continue PTA arimidex 1mg  daily      FEN  -ivf as needed  -diet: NPO until trops peaked  -Monitor lytes    Prophylaxis- Heparin gtt    Code Status - DNAR-Full Intervention  Dispo-  Admit to Cardiology    Kizzie Ide, MD  IM PGY-2  Pager [phone]    This note was created using Dragon Dictation software, hence some grammatical errors may still be present despite editing at the time of the dictation.     _______________________________________________________________________________  Primary Care Physician: Rockwell Germany      Chief Complaint:  Shortness of breath    Patient discussed with cardiology fellow on call    History of Present Illness: Katrina Wang is a 84 y.o. female who presents as a transfer from [facility] ED where she originally presented due to vomiting, chills, fatigue, and shortness of breath. At the OSH she was treated for possible COPD exacerbation. Troponins were obtained and were mildly elevated and uptrending. Due to her known cardiac history she was transferred to [facility] for further monitoring and escalation of care. On admit to [facility], her EKG is without acute changes although her troponin does continue to trend up. She notes she feels better than when she initially presented to the OSH and continues to deny chest pain.     Medical History:   Diagnosis Date   ? Acute saddle pulmonary embolism with acute cor pulmonale (HCC)    ? CAD (coronary artery disease) 07/04/2008   ? CAD (coronary artery disease), native coronary artery 07/04/2008    1999: Complex stent PCI to proximal OMB  03/07/12: EF 60%, 20% ostial left main, 40% prox LAD, 70% mid, 40% distal LAD at previous POBA site; Dominant CFX - 20% prox-CFX, 40% mid-CFX, 30% prox 1st OMB,  50% instent restenosis in 2nd OMB, 40-50% distal 2nd OMB, 40-50% left PDA, small diffusely disease RCA without significant obstruction, per cath by Dr. Vivianne Spence.  No PCI indicated.    ? Carotid artery disease (HCC) 07/04/2008   ? Essential hypertension 07/04/2008   ? H/O two vessel coronary artery bypass graft    ? HLD (hyperlipidemia) 07/04/2008   ? HTN (hypertension) 07/04/2008   ? Hypothyroidism 09/30/2017   ? PVD (peripheral vascular disease) (HCC) 07/04/2008   ? Type 2 diabetes mellitus [facility])      Surgical History:   Procedure Laterality Date   ? HX CORONARY STENT PLACEMENT  1999   ? TOTAL ABDOMINAL HYSTERECTOMY  08/2009   ? Left Heart Catheterization With Ventriculogram Left 10/15/2016    Performed by Nat Math, [facility] at [facility] CATH LAB   ? Coronary Angiography N/A 10/15/2016    Performed by Nat Math, MD at [facility] CATH LAB   ? Possible Percutaneous Coronary Intervention N/A 10/15/2016    Performed by Nat Math, MD at [facility] CATH LAB   ? BYPASS GRAFT CORONARY ARTERY; LEFT INTERNAL MAMMARY ARTERY HARVEST; RIGHT ENDOSCOPIC SAPHENOUS VEIN HARVEST N/A 10/16/2016    Performed by Collene Schlichter, MD at [facility] CVOR   ? CAROTID ENDARDECTOMY  LEFT   ? ILIO-FEMORAL BYPASS GRAFT      BILATERAL STENTS     Family History   Problem Relation Age of Onset   ? Coronary Artery Disease Mother    ? Cancer Father      Social History     Socioeconomic History   ? Marital status: Widowed     Spouse name: Not on file   ? Number of children: Not on file   ? Years of education: Not on file   ? Highest education level: Not on file   Occupational History   ? Not on file   Tobacco Use   ? Smoking status: Former Smoker     Packs/day: 1.00     Years: 20.00     Pack years: 20.00     Types: Cigarettes     Quit date: 12/28/1988     Years since quitting: 31.8   ? Smokeless tobacco: Never Used   ? Tobacco comment: Pt quit 20 years ago.   Substance and Sexual Activity   ? Alcohol use: No   ? Drug use: No   ? Sexual activity: Not on file   Other Topics Concern   ? Not on file   Social History Narrative    Retired Futures trader. Lives in [place] with husband and daughter.     Social Determinants of Health     Financial Resource Strain:    ? Difficulty of Paying Living Expenses:    Food Insecurity:    ? Worried About Programme researcher, broadcasting/film/video in the Last Year:    ? Barista in the Last Year:    Transportation Needs:    ? Freight forwarder (Medical):    ? Lack of Transportation (Non-Medical):    Physical Activity:    ? Days of Exercise per Week:    ? Minutes of Exercise per Session:    Stress:    ? Feeling of Stress :    Social Connections:    ? Frequency of Communication with Friends and Family:    ? Frequency of Social Gatherings with Friends and Family:    ? Attends Religious Services:    ? Active Member of Clubs or Organizations:    ? Attends Banker Meetings:    ? Marital Status:    Intimate Partner Violence:    ? Fear of Current or Ex-Partner:    ? Emotionally Abused:    ? Physically Abused:    ? Sexually Abused:         Allergies:  Imdur [isosorbide mononitrate], Iodinated contrast media, Lisinopril, and Atorvastatin    Medications:  Medications Prior to Admission   Medication Sig   ? acetaminophen (TYLENOL) 325 mg tablet Take 2 tablets by mouth every 6 hours as needed.   ? amLODIPine (NORVASC) 10 mg tablet Take 10 mg by mouth daily.   ? anastrozole (ARIMIDEX) 1 mg tablet Take 1 tablet by mouth daily.   ? apixaban (ELIQUIS) 5 mg tablet Take 2  tablets by mouth twice daily for 5 days, then 1 tablet by mouth twice daily.   ? aspirin EC 81 mg tablet Take 81 mg by mouth daily. Take with food.   ? calcium citrate (CALCITRATE) 950 mg tab Take 1 tablet by mouth daily.   ? CHOLECALCIFEROL (VITAMIN D3) (VITAMIN D3 PO) Take 1 tablet by mouth daily.   ? coenzyme Q10(+) 100 mg cap Take 100 mg by mouth.   ? cranberry conc-ascorbic  acid 12,600-20 mg cap Take  by mouth.   ? CYANOCOBALAMIN (VITAMIN B-12) (VITAMIN B12 PO) Take 1 tablet by mouth daily.   ? escitalopram oxalate (LEXAPRO) 20 mg tablet Take 1 tablet by mouth daily.   ? glimepiride (AMARYL) 1 mg tablet Take 1 mg by mouth twice daily.   ? iron,carb/vit C/vit B12/folic (IRON 100 PLUS PO) Take 1 tablet by mouth daily.   ? loratadine (CLARITIN) 10 mg tablet Take 10 mg by mouth every morning.   ? metFORMIN (GLUCOPHAGE) 1,000 mg tablet Take 1 tablet by mouth twice daily with meals.   ? metoprolol tartrate (LOPRESSOR) 25 mg tablet Take 1 tablet by mouth twice daily.   ? nitroglycerin (NITROSTAT) 0.4 mg tablet 1 Tab every 5 minutes as needed for Chest Pain (Call Dr. if pain does not resolve after 2 consecutive uses).   ? ondansetron (ZOFRAN) 4 mg tablet Take 1-2 tablets by mouth every 6 hours as needed.   ? rosuvastatin (CRESTOR) 20 mg tablet Take 1 tablet by mouth daily.   ? senna/docusate (SENOKOT-S) 8.6/50 mg tablet Take 2 tablets by mouth twice daily.   ? SYNTHROID 100 mcg tablet Take 1 tablet by mouth daily. Review of Systems:    Constitutional: positive for chills, no fever, weight loss  Eyes: negative for discharge  Ears, nose, mouth, throat, and face: negative for pain or discharge  Respiratory: positive for improved SOB, no wheezing  Cardiovascular: negative for chest pain, palpitations  Gastrointestinal: negative for nausea, vomiting, diarrhea  Genitourinary:negative for burning sensation, cloudy foul smelling urine  Hematologic/lymphatic: negative for swollen or tender lymph nodes  Musculoskeletal: Negative for chronic pain  Neurological: negative for loss of  Muscle strength, sensation, slurring speech,   Behavioral/Psych: negative for agitation, sad mood, suicidal ideation     Physical Exam:  Vital Signs: Last Filed In 24 Hours Vital Signs: 24 Hour Range   BP: 120/63 (10/25 1900)  Temp: 36.3 ?C (97.3 ?F) (10/25 1900)  Pulse: 78 (10/25 1900)  Respirations: 16 PER MINUTE (10/25 1900)  SpO2: 97 % (10/25 1900)  Height: 165.1 cm (65) (10/25 1900) BP: (120)/(63)   Temp:  [36.3 ?C (97.3 ?F)]   Pulse:  [78]   Respirations:  [16 PER MINUTE]   SpO2:  [97 %]             General: AO3x, no  distress   Head: NCAT  Eyes: PERRLA EOMI, conjunctival pallor, no nystagmus, icterus in both eyes  ENT: Normal external canals b/l  Neck: supple   Lungs: CTAB  Chest wall: No tenderness, no gynecomastia, no telangiectasia  Heart: RRR, no murmurs.  Abdomen: soft ,non-tender, bs+  Extremities: No edema noted  Peripheral pulses: 2+ and symmetric, all extremities  Lymph nodes: No cervical LAD  Neurologic:grossly normal     Lab/Radiology/Other Diagnostic Tests:  24-hour labs:    Results for orders placed or performed during the hospital encounter of 10/21/20 (from the past 24 hour(s))   TROPONIN-I    Collection Time: 10/21/20  7:20 PM   Result Value Ref Range    Troponin-I 0.46 (H) 0.0 - 0.05 NG/ML   COMPREHENSIVE METABOLIC PANEL    Collection Time: 10/21/20  7:20 PM   Result Value Ref Range    Sodium 136 (L) 137 - 147 MMOL/L    Potassium 4.5 3.5 - 5.1 MMOL/L    Chloride 106 98 - 110 MMOL/L    Glucose 263 (H) 70 - 100 MG/DL    Blood Urea Nitrogen  22 7 - 25 MG/DL    Creatinine 1.61 (H) 0.4 - 1.00 MG/DL    Calcium 8.1 (L) 8.5 - 10.6 MG/DL    Total Protein 6.3 6.0 - 8.0 G/DL    Total Bilirubin 0.3 0.3 - 1.2 MG/DL    Albumin 3.2 (L) 3.5 - 5.0 G/DL    Alk Phosphatase 53 25 - 110 U/L    AST (SGOT) 19 7 - 40 U/L    CO2 20 (L) 21 - 30 MMOL/L    ALT (SGPT) 12 7 - 56 U/L    Anion Gap 10 3 - 12    eGFR Non African American 50 (L) >60 mL/min    eGFR African American >60 >60 mL/min   MAGNESIUM    Collection Time: 10/21/20  7:20 PM   Result Value Ref Range    Magnesium 1.8 1.6 - 2.6 mg/dL   CBC AND DIFF    Collection Time: 10/21/20  7:54 PM   Result Value Ref Range    White Blood Cells 14.0 (H) 4.5 - 11.0 K/UL    RBC 4.02 4.0 - 5.0 M/UL    Hemoglobin 11.2 (L) 12.0 - 15.0 GM/DL    Hematocrit 09.6 (L) 36 - 45 %    MCV 86.4 80 - 100 FL    MCH 27.8 26 - 34 PG    MCHC 32.1 32.0 - 36.0 G/DL    RDW 04.5 (H) 11 - 15 %    Platelet Count 218 150 - 400 K/UL    MPV 8.8 7 - 11 FL    Neutrophils 96 (H) 41 - 77 %    Lymphocytes 3 (L) 24 - 44 %    Monocytes 1 (L) 4 - 12 %    Eosinophils 0 0 - 5 %    Basophils 0 0 - 2 %    Absolute Neutrophil Count 13.43 (H) 1.8 - 7.0 K/UL    Absolute Lymph Count 0.34 (L) 1.0 - 4.8 K/UL    Absolute Monocyte Count 0.19 0 - 0.80 K/UL    Absolute Eosinophil Count 0.00 0 - 0.45 K/UL    Absolute Basophil Count 0.02 0 - 0.20 K/UL        Pertinent radiology reviewed.    Old records reviewed  Kizzie Ide, MD  Pager -[phone]

## 2020-10-23 ENCOUNTER — Encounter: Admit: 2020-10-23 | Discharge: 2020-10-23 | Payer: MEDICARE

## 2020-10-23 MED ADMIN — SODIUM CHLORIDE 0.9 % IV SOLP [27838]: 1000.000 mL | INTRAVENOUS | @ 20:00:00 | Stop: 2020-10-24 | NDC 00338004904

## 2020-10-23 MED ADMIN — FAMOTIDINE 20 MG PO TAB [10011]: 20 mg | ORAL | @ 14:00:00 | NDC 60687059511

## 2020-10-23 MED ADMIN — APIXABAN 5 MG PO TAB [315778]: 5 mg | ORAL | @ 22:00:00 | NDC 00003089431

## 2020-10-23 MED ADMIN — PREDNISONE 50 MG PO TAB [6498]: 60 mg | ORAL | @ 01:00:00 | Stop: 2020-10-24 | NDC 00054001920

## 2020-10-23 MED ADMIN — ASPIRIN 81 MG PO CHEW [680]: 81 mg | ORAL | @ 20:00:00 | Stop: 2020-10-23 | NDC 66553000201

## 2020-10-23 MED ADMIN — PREDNISONE 50 MG PO TAB [6498]: 60 mg | ORAL | @ 13:00:00 | Stop: 2020-10-23 | NDC 00054001920

## 2020-10-23 MED ADMIN — ASPIRIN 81 MG PO CHEW [680]: 243 mg | ORAL | @ 14:00:00 | Stop: 2020-10-23 | NDC 66553000201

## 2020-10-23 MED ADMIN — NPH INSULIN HUMAN RECOMB 100 UNIT/ML (3 ML) SC PEN [86978]: 12 [IU] | SUBCUTANEOUS | @ 14:00:00 | Stop: 2020-10-23 | NDC 00002880501

## 2020-10-23 MED ADMIN — PREDNISONE 10 MG PO TAB [6494]: 60 mg | ORAL | @ 13:00:00 | Stop: 2020-10-23 | NDC 00054001720

## 2020-10-23 MED ADMIN — METOPROLOL TARTRATE 25 MG PO TAB [37637]: 37.5 mg | ORAL | @ 02:00:00 | NDC 62584026511

## 2020-10-23 MED ADMIN — METOPROLOL TARTRATE 25 MG PO TAB [37637]: 37.5 mg | ORAL | @ 12:00:00 | NDC 62584026511

## 2020-10-23 MED ADMIN — DIPHENHYDRAMINE HCL 50 MG PO CAP [2510]: 50 mg | ORAL | @ 17:00:00 | Stop: 2020-10-23 | NDC 00904205661

## 2020-10-23 MED ADMIN — PREDNISONE 10 MG PO TAB [6494]: 60 mg | ORAL | @ 01:00:00 | Stop: 2020-10-24 | NDC 00054001720

## 2020-10-24 ENCOUNTER — Encounter: Admit: 2020-10-24 | Discharge: 2020-10-24 | Payer: MEDICARE

## 2020-10-24 DIAGNOSIS — E1142 Type 2 diabetes mellitus with diabetic polyneuropathy: Secondary | ICD-10-CM

## 2020-10-24 MED ORDER — SITAGLIPTIN 50 MG PO TAB
50 mg | ORAL_TABLET | Freq: Every day | ORAL | 0 refills | 90.00000 days | Status: AC
Start: 2020-10-24 — End: ?

## 2020-10-24 MED ORDER — SITAGLIPTIN 50 MG PO TAB
50 mg | ORAL_TABLET | Freq: Every day | ORAL | 0 refills | 90.00000 days | Status: DC
Start: 2020-10-24 — End: 2020-10-24

## 2020-10-24 MED ORDER — INSULIN LISPRO 100 UNIT/ML SC SOLN
Freq: Three times a day (TID) | 0 refills | 30.00000 days | Status: AC
Start: 2020-10-24 — End: ?

## 2020-10-24 MED ORDER — ONGLYZA 5 MG PO TAB
5 mg | ORAL_TABLET | Freq: Every day | ORAL | 0 refills | Status: DC
Start: 2020-10-24 — End: 2020-10-24

## 2020-10-24 MED ORDER — OZEMPIC 0.25 MG OR 0.5 MG(2 MG/1.5 ML) SC PNIJ
.25 mg | SUBCUTANEOUS | 0 refills | Status: DC
Start: 2020-10-24 — End: 2020-10-24

## 2020-10-24 MED ADMIN — BISACODYL 10 MG RE SUPP [1080]: 10 mg | RECTAL | @ 21:00:00 | Stop: 2020-10-24 | NDC 00574705012

## 2020-10-24 MED ADMIN — SENNOSIDES-DOCUSATE SODIUM 8.6-50 MG PO TAB [40926]: 1 | ORAL | @ 14:00:00 | NDC 70000052601

## 2020-10-24 MED ADMIN — APIXABAN 5 MG PO TAB [315778]: 5 mg | ORAL | @ 14:00:00 | NDC 00003089431

## 2020-10-24 MED ADMIN — ONDANSETRON HCL (PF) 4 MG/2 ML IJ SOLN [136012]: 4 mg | INTRAVENOUS | @ 04:00:00 | NDC 36000001225

## 2020-10-24 MED ADMIN — ACETAMINOPHEN 325 MG PO TAB [101]: 650 mg | ORAL | @ 14:00:00 | NDC 00904677361

## 2020-10-24 MED ADMIN — LACTATED RINGERS IV SOLP [4318]: 500 mL | INTRAVENOUS | @ 06:00:00 | Stop: 2020-10-24 | NDC 00338011704

## 2020-10-24 MED ADMIN — CLOPIDOGREL 300 MG PO TAB [166651]: 300 mg | ORAL | @ 11:00:00 | Stop: 2020-10-24 | NDC 00904646707

## 2020-10-24 MED ADMIN — METOPROLOL TARTRATE 25 MG PO TAB [37637]: 37.5 mg | ORAL | @ 01:00:00 | NDC 62584026511

## 2020-10-24 MED ADMIN — ACETAMINOPHEN 325 MG PO TAB [101]: 650 mg | ORAL | @ 04:00:00 | NDC 00904677361

## 2020-10-24 MED ADMIN — OXYCODONE 5 MG PO TAB [10814]: 5 mg | ORAL | @ 06:00:00 | Stop: 2020-10-24 | NDC 00904696661

## 2020-10-24 MED ADMIN — LOSARTAN 25 MG PO TAB [80886]: 12.5 mg | ORAL | @ 14:00:00 | NDC 00904704761

## 2020-10-24 MED ADMIN — LACTATED RINGERS IV SOLP [4318]: 500 mL | INTRAVENOUS | @ 11:00:00 | Stop: 2020-10-24 | NDC 00338011704

## 2020-10-24 MED ADMIN — SITAGLIPTIN 50 MG PO TAB [135915]: 50 mg | ORAL | @ 18:00:00 | NDC 00006011201

## 2020-10-24 MED ADMIN — POTASSIUM CHLORIDE 20 MEQ PO TBTQ [35943]: 20 meq | ORAL | @ 14:00:00 | Stop: 2020-10-24 | NDC 00245531989

## 2020-10-24 MED ADMIN — GLIMEPIRIDE 2 MG PO TAB [16356]: 1 mg | ORAL | @ 21:00:00 | NDC 68084032611

## 2020-10-24 MED ADMIN — FAMOTIDINE 20 MG PO TAB [10011]: 20 mg | ORAL | @ 14:00:00 | NDC 63739064510

## 2020-10-24 MED ADMIN — METOPROLOL TARTRATE 25 MG PO TAB [37637]: 37.5 mg | ORAL | @ 14:00:00 | Stop: 2020-10-24 | NDC 62584026511

## 2020-10-24 NOTE — Progress Notes
Test scripts being sent for diabetes medications to check costs.    Sitaltiptin 50mg  daily 43$ every month.   Semaglutide 90$ every 42 days.       Scripts cancelled out.   Will discuss with patient.     Tristan Schroeder, MD

## 2020-10-25 ENCOUNTER — Encounter: Admit: 2020-10-25 | Discharge: 2020-10-25 | Payer: MEDICARE

## 2020-10-25 ENCOUNTER — Inpatient Hospital Stay: Admit: 2020-10-25 | Discharge: 2020-10-25 | Payer: MEDICARE

## 2020-10-25 MED ADMIN — APIXABAN 5 MG PO TAB [315778]: 5 mg | ORAL | @ 14:00:00 | Stop: 2020-10-25 | NDC 00003089431

## 2020-10-25 MED ADMIN — SODIUM CHLORIDE 0.9 % IV PGBK (MB+) [95161]: 2 g | INTRAVENOUS | @ 18:00:00 | Stop: 2020-10-25 | NDC 00338915930

## 2020-10-25 MED ADMIN — METOPROLOL TARTRATE 25 MG PO TAB [37637]: 25 mg | ORAL | @ 14:00:00 | NDC 62584026511

## 2020-10-25 MED ADMIN — GLIMEPIRIDE 2 MG PO TAB [16356]: 1 mg | ORAL | @ 11:00:00 | Stop: 2020-10-25 | NDC 68084032611

## 2020-10-25 MED ADMIN — CEFEPIME 2 GRAM IJ SOLR [78195]: 2 g | INTRAVENOUS | @ 18:00:00 | Stop: 2020-10-25 | NDC 00409973501

## 2020-10-25 MED ADMIN — SENNOSIDES-DOCUSATE SODIUM 8.6-50 MG PO TAB [40926]: 1 | ORAL | @ 02:00:00 | NDC 70000052601

## 2020-10-25 MED ADMIN — METOPROLOL TARTRATE 25 MG PO TAB [37637]: 25 mg | ORAL | @ 02:00:00 | NDC 62584026511

## 2020-10-25 MED ADMIN — ACETAMINOPHEN 325 MG PO TAB [101]: 650 mg | ORAL | @ 03:00:00 | NDC 00904677361

## 2020-10-25 MED ADMIN — SITAGLIPTIN 50 MG PO TAB [135915]: 25 mg | ORAL | @ 14:00:00 | NDC 00006011201

## 2020-10-25 MED ADMIN — APIXABAN 5 MG PO TAB [315778]: 5 mg | ORAL | @ 02:00:00 | NDC 00003089431

## 2020-10-25 MED ADMIN — FAMOTIDINE 20 MG PO TAB [10011]: 20 mg | ORAL | @ 14:00:00 | NDC 00172572880

## 2020-10-25 MED ADMIN — SENNOSIDES-DOCUSATE SODIUM 8.6-50 MG PO TAB [40926]: 1 | ORAL | @ 14:00:00 | NDC 70000052601

## 2020-10-25 MED ADMIN — CLOPIDOGREL 75 MG PO TAB [78966]: 75 mg | ORAL | @ 14:00:00 | NDC 00904629461

## 2020-10-25 MED ADMIN — POTASSIUM CHLORIDE 20 MEQ PO TBTQ [35943]: 20 meq | ORAL | @ 14:00:00 | Stop: 2020-10-25 | NDC 00245531989

## 2020-10-25 MED ADMIN — SODIUM CHLORIDE 0.9 % IV SOLP [27838]: INTRAVENOUS | @ 18:00:00 | NDC 00338004902

## 2020-10-25 MED ADMIN — LOSARTAN 50 MG PO TAB [76938]: 12.5 mg | ORAL | @ 14:00:00 | NDC 68084034711

## 2020-10-26 MED ADMIN — CLOPIDOGREL 75 MG PO TAB [78966]: 75 mg | ORAL | @ 14:00:00 | NDC 00904629461

## 2020-10-26 MED ADMIN — FAMOTIDINE 20 MG PO TAB [10011]: 20 mg | ORAL | @ 14:00:00 | NDC 00172572880

## 2020-10-26 MED ADMIN — METOPROLOL TARTRATE 25 MG PO TAB [37637]: 25 mg | ORAL | @ 02:00:00 | NDC 62584026511

## 2020-10-26 MED ADMIN — SENNOSIDES-DOCUSATE SODIUM 8.6-50 MG PO TAB [40926]: 1 | ORAL | @ 02:00:00 | NDC 70000052601

## 2020-10-26 MED ADMIN — METOPROLOL TARTRATE 25 MG PO TAB [37637]: 25 mg | ORAL | @ 14:00:00 | NDC 62584026511

## 2020-10-26 MED ADMIN — SITAGLIPTIN 25 MG PO TAB [135913]: 25 mg | ORAL | @ 14:00:00 | Stop: 2020-10-26 | NDC 00006022101

## 2020-10-26 MED ADMIN — SENNOSIDES-DOCUSATE SODIUM 8.6-50 MG PO TAB [40926]: 1 | ORAL | @ 14:00:00 | NDC 70000052601

## 2020-10-27 MED ADMIN — ACETAMINOPHEN 325 MG PO TAB [101]: 650 mg | ORAL | @ 07:00:00 | NDC 00904677361

## 2020-10-27 MED ADMIN — ASPIRIN 81 MG PO CHEW [680]: 81 mg | ORAL | @ 02:00:00 | NDC 66553000201

## 2020-10-27 MED ADMIN — ASPIRIN 81 MG PO CHEW [680]: 81 mg | ORAL | @ 13:00:00 | NDC 66553000201

## 2020-10-27 MED ADMIN — SENNOSIDES-DOCUSATE SODIUM 8.6-50 MG PO TAB [40926]: 1 | ORAL | @ 02:00:00 | NDC 70000052601

## 2020-10-27 MED ADMIN — SODIUM CHLORIDE 0.9 % IV SOLP [27838]: 1000 mL | INTRAVENOUS | @ 13:00:00 | Stop: 2020-10-27 | NDC 00338004904

## 2020-10-27 MED ADMIN — CLOPIDOGREL 75 MG PO TAB [78966]: 75 mg | ORAL | @ 13:00:00 | NDC 00904629461

## 2020-10-27 MED ADMIN — METOPROLOL TARTRATE 25 MG PO TAB [37637]: 25 mg | ORAL | @ 02:00:00 | NDC 62584026511

## 2020-10-27 MED ADMIN — SENNOSIDES-DOCUSATE SODIUM 8.6-50 MG PO TAB [40926]: 1 | ORAL | @ 13:00:00 | NDC 70000052601

## 2020-10-27 MED ADMIN — FAMOTIDINE 20 MG PO TAB [10011]: 20 mg | ORAL | @ 13:00:00 | NDC 00172572880

## 2020-10-28 ENCOUNTER — Encounter: Admit: 2020-10-28 | Discharge: 2020-10-28 | Payer: MEDICARE

## 2020-10-28 MED ADMIN — SENNOSIDES-DOCUSATE SODIUM 8.6-50 MG PO TAB [40926]: 1 | ORAL | @ 01:00:00 | NDC 67618011060

## 2020-10-28 MED ADMIN — CLOPIDOGREL 75 MG PO TAB [78966]: 75 mg | ORAL | @ 14:00:00 | NDC 00904629461

## 2020-10-28 MED ADMIN — METOPROLOL TARTRATE 25 MG PO TAB [37637]: 25 mg | ORAL | @ 14:00:00 | NDC 62584026511

## 2020-10-28 MED ADMIN — METOPROLOL TARTRATE 25 MG PO TAB [37637]: 25 mg | ORAL | @ 01:00:00 | NDC 62584026511

## 2020-10-28 MED ADMIN — ASPIRIN 81 MG PO CHEW [680]: 81 mg | ORAL | @ 14:00:00 | NDC 66553000201

## 2020-10-28 MED ADMIN — SODIUM CHLORIDE 0.9 % IV SOLP [27838]: 1000 mL | INTRAVENOUS | @ 16:00:00 | Stop: 2020-10-28 | NDC 00338004904

## 2020-10-28 MED ADMIN — SENNOSIDES-DOCUSATE SODIUM 8.6-50 MG PO TAB [40926]: 1 | ORAL | @ 14:00:00 | NDC 70000052601

## 2020-10-29 ENCOUNTER — Encounter: Admit: 2020-10-29 | Discharge: 2020-10-29 | Payer: MEDICARE

## 2020-10-29 MED ADMIN — METOPROLOL TARTRATE 25 MG PO TAB [37637]: 25 mg | ORAL | @ 01:00:00 | NDC 62584026511

## 2020-10-29 MED ADMIN — ASPIRIN 81 MG PO CHEW [680]: 81 mg | ORAL | @ 14:00:00 | Stop: 2020-10-29 | NDC 66553000201

## 2020-10-29 MED ADMIN — SENNOSIDES-DOCUSATE SODIUM 8.6-50 MG PO TAB [40926]: 1 | ORAL | @ 01:00:00 | NDC 70000052601

## 2020-10-29 MED ADMIN — METOPROLOL TARTRATE 25 MG PO TAB [37637]: 25 mg | ORAL | @ 14:00:00 | Stop: 2020-10-29 | NDC 62584026511

## 2020-10-29 MED ADMIN — SENNOSIDES-DOCUSATE SODIUM 8.6-50 MG PO TAB [40926]: 1 | ORAL | @ 14:00:00 | Stop: 2020-10-29 | NDC 67618011060

## 2020-10-29 MED ADMIN — CLOPIDOGREL 75 MG PO TAB [78966]: 75 mg | ORAL | @ 14:00:00 | Stop: 2020-10-29 | NDC 00904629461

## 2020-10-29 MED ADMIN — FAMOTIDINE 20 MG PO TAB [10011]: 20 mg | ORAL | @ 14:00:00 | Stop: 2020-10-29 | NDC 00172572880

## 2020-10-29 MED ADMIN — LOSARTAN 25 MG PO TAB [80886]: 12.5 mg | ORAL | @ 14:00:00 | Stop: 2020-10-29 | NDC 00904704761

## 2020-10-29 NOTE — Telephone Encounter
Attempted to call patient to discuss NEGATIVE COVID-19 test result; no answer. Left VM directing patient to MyChart, as patient is active on MyChart. RN sent MyChart message notifying patient of result.

## 2020-11-08 ENCOUNTER — Encounter: Admit: 2020-11-08 | Discharge: 2020-11-08 | Payer: MEDICARE

## 2020-11-08 NOTE — Telephone Encounter
I called the pt to get her ready for her telehealth appointment with Suzy Bouchard. I called [phone], [phone], and her son Jillienne Egner. I was unable to reach the pt or her son. I LVM for the pt or son to call me back at [phone]

## 2020-11-16 ENCOUNTER — Encounter: Admit: 2020-11-16 | Discharge: 2020-11-16 | Payer: MEDICARE

## 2020-12-26 ENCOUNTER — Encounter: Admit: 2020-12-26 | Discharge: 2020-12-26 | Payer: MEDICARE

## 2020-12-26 DIAGNOSIS — I779 Disorder of arteries and arterioles, unspecified: Secondary | ICD-10-CM

## 2020-12-26 DIAGNOSIS — I739 Peripheral vascular disease, unspecified: Secondary | ICD-10-CM

## 2020-12-26 DIAGNOSIS — I2602 Saddle embolus of pulmonary artery with acute cor pulmonale: Secondary | ICD-10-CM

## 2020-12-26 DIAGNOSIS — Z951 Presence of aortocoronary bypass graft: Secondary | ICD-10-CM

## 2020-12-26 DIAGNOSIS — E119 Type 2 diabetes mellitus without complications: Secondary | ICD-10-CM

## 2020-12-26 DIAGNOSIS — R55 Syncope and collapse: Secondary | ICD-10-CM

## 2020-12-26 DIAGNOSIS — E785 Hyperlipidemia, unspecified: Secondary | ICD-10-CM

## 2020-12-26 DIAGNOSIS — I2510 Atherosclerotic heart disease of native coronary artery without angina pectoris: Secondary | ICD-10-CM

## 2020-12-26 DIAGNOSIS — E039 Hypothyroidism, unspecified: Secondary | ICD-10-CM

## 2020-12-26 DIAGNOSIS — I10 Essential (primary) hypertension: Secondary | ICD-10-CM

## 2020-12-30 ENCOUNTER — Encounter: Admit: 2020-12-30 | Discharge: 2020-12-30 | Payer: MEDICARE

## 2020-12-30 DIAGNOSIS — I10 Essential (primary) hypertension: Secondary | ICD-10-CM

## 2020-12-30 DIAGNOSIS — I2602 Saddle embolus of pulmonary artery with acute cor pulmonale: Secondary | ICD-10-CM

## 2020-12-30 DIAGNOSIS — R55 Syncope and collapse: Secondary | ICD-10-CM

## 2021-01-24 ENCOUNTER — Encounter: Admit: 2021-01-24 | Discharge: 2021-01-24 | Payer: MEDICARE

## 2021-01-24 DIAGNOSIS — N3289 Other specified disorders of bladder: Secondary | ICD-10-CM

## 2021-01-28 ENCOUNTER — Encounter: Admit: 2021-01-28 | Discharge: 2021-01-28 | Payer: MEDICARE

## 2021-01-30 ENCOUNTER — Encounter: Admit: 2021-01-30 | Discharge: 2021-01-30 | Payer: MEDICARE

## 2021-08-12 ENCOUNTER — Encounter: Admit: 2021-08-12 | Discharge: 2021-08-12 | Payer: MEDICARE

## 2021-08-20 ENCOUNTER — Encounter: Admit: 2021-08-20 | Discharge: 2021-08-20 | Payer: MEDICARE

## 2021-08-21 ENCOUNTER — Encounter: Admit: 2021-08-21 | Discharge: 2021-08-21 | Payer: MEDICARE

## 2021-08-26 ENCOUNTER — Encounter: Admit: 2021-08-26 | Discharge: 2021-08-26 | Payer: MEDICARE

## 2021-08-26 ENCOUNTER — Ambulatory Visit: Admit: 2021-08-26 | Discharge: 2021-08-26 | Payer: MEDICARE

## 2021-08-26 DIAGNOSIS — I10 Essential (primary) hypertension: Secondary | ICD-10-CM

## 2021-08-26 DIAGNOSIS — I2510 Atherosclerotic heart disease of native coronary artery without angina pectoris: Secondary | ICD-10-CM

## 2021-08-26 DIAGNOSIS — J449 Chronic obstructive pulmonary disease, unspecified: Secondary | ICD-10-CM

## 2021-08-26 DIAGNOSIS — R0902 Hypoxemia: Secondary | ICD-10-CM

## 2021-08-26 DIAGNOSIS — R531 Weakness: Secondary | ICD-10-CM

## 2021-08-26 DIAGNOSIS — E119 Type 2 diabetes mellitus without complications: Secondary | ICD-10-CM

## 2021-08-26 DIAGNOSIS — J189 Pneumonia, unspecified organism: Secondary | ICD-10-CM

## 2021-08-26 NOTE — Progress Notes
Cardiology Consult Note    Name:  Katrina Wang                                                       MRN:  [id number]   Admission Date:  (Not on file)  Location: Room/bed info not found                     Reason for Consultation/Chief Complaint: Shortness of breath    Assessment:   1. Acute on chronic shortness of breath  2. Pneumonia, bacterial vs. viral (improving on antibiotics)  3. COPD exacerbation, being treated with steroids  4. Pleural effusion, most likely related to pneumonia  5. Elevated troponin, likely demand ischemia in the setting of infection  6. Coronary artery disease s/p CABG in 2017, PCI to left main/left circumflex October 2021  7. 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 Peripheral vascular disease with bilateral iliofemoral bypass graft/stents  9. Carotid artery disease s/p carotid endarterectomy  10. Uterine cancer s/p hysterectomy  11. Hypertension  12. Dyslipidemia   13. Diabetes mellitus  14. CKD stage IIIa    Impression:  Delightful 85 year old female who was hospitalized with hypoxic respiratory failure.  She has noticed worsening shortness of breath over the last 6 weeks.  This is in the setting of shortness of breath which has been chronic over the last 2+ years.  Has slowly been increasing nasal cannula oxygen at home.  She tells me that her oxygen saturations have gotten as low as 66%.  She has felt extremely weak over the last couple weeks as well.  She was admitted to the hospital on 08/21/2021 and has been treated for left lower lobe pneumonia and COPD exacerbation.  She has also been gently diuresed and is negative approximately 1 L.  Interestingly she had very mild rise in her troponin over the first 24 hours of hospitalization.  Trend was discontinued and repeat troponin completed yesterday, 08/25/2021 was elevated to 2.26.  While she has had chest pressure from time to time in the past, she is not having any chest discomfort at this time.  I suspect her hypoxic respiratory failure is multifactorial in nature with the predominant etiology being left lower lobe pneumonia.  Her shortness of breath is definitely improved over the last couple of days with treatment.  Her echocardiogram today demonstrated hyperdynamic left ventricular systolic function with grade II diastolic dysfunction and elevated left atrial pressure.  Right ventricular size and systolic function are normal.  Her central venous pressure is normal.  By physical examination she appears to be pretty close to euvolemic.  I do not think further diuresis is indicated at this time.  Continue treatment for pneumonia and COPD exacerbation.  Repeat troponin this afternoon was essentially unchanged.  I would recommend obtaining a another troponin this evening and if downtrending I think we can hold off on further inpatient ischemic evaluation.  There is no segmental wall motion abnormality on her echocardiogram to suggest an acute or subacute coronary syndrome.  A stress myocardial perfusion study could be obtained as an outpatient.  Her echocardiogram also demonstrates normal right ventricular size and systolic function.  This is helpful given her prior history of saddle pulmonary embolism and RV dysfunction.  I suspect PE to be  less likely contributing to her shortness of breath given improvement with treatment, but if she continues to be hypoxic or shortness of breath worsens would strongly consider completing a CTA chest.      Recommendations:  ? Agree with echocardiogram ordered for today  ? Trend troponin to peak, if continues to uptrend on lab work this evening or develops chest discomfort would initiate heparin drip and consider transfer to [facility] hospital for further ischemic evaluation  ? If troponin downtrends then I think we can plan on functional ischemic study as an outpatient  ? Continue aspirin 81 mg daily.  She is already on clopidogrel for dual antiplatelet therapy  ? Clopidogrel can be stopped in October 2022, consider reinitiation of anticoagulation at that time given history of saddle pulmonary embolism which was reportedly unprovoked  ? Continue all other cardiovascular medications  ? Appears euvolemic or nearly euvolemic on examination.  Think we can hold on further diuresis  ? We will arrange follow-up with cardiology here in [place] in the next 7 to 10 days.  She will then follow-up with her primary cardiologist thereafter.    Thank you for allowing [place] the opportunity to participate in the care of Ms. Katrina Wang. Please do not hesitate to reach out with any additional questions or concerns.     Lissa Morales, MD  Division of Cardiovascular Medicine    Subjective:     Primary Cardiologist: Beckie Salts, MD    History of Present Illness:  Katrina Wang is a 85 y.o. female with complex past medical history.  She has been experiencing acutely worsening shortness of breath over the last 6 weeks.  At home she was noticing oxygen saturations as low as 66% and she was up titrating her nasal cannula oxygen at night.  No chest pain.  She came to the hospital and was admitted on 08/21/2021.  She has been treated for pneumonia and COPD exacerbation.  Gentle diuresis.  Her symptoms have improved quite a bit.  She is no longer requiring supplemental oxygenation.  She walked the halls this morning and while she was short of breath by the end this was a significant improvement from how she was previously.  At home she has not been having any lower extremity swelling.  She denies paroxysmal nocturnal dyspnea or orthopnea.  No palpitations or heart racing.    Most Recent Echocardiogram: 08/26/2021  ? Technically difficult study.  ? The left ventricular size is normal with mild concentric hypertrophy. Hyperdynamic LV with the ejection fraction by Simpson's biplane method is 78%. There are no segmental wall motion abnormalities. Moderate diastolic dysfunction with elevated left atrial pressure.  ? The right ventricular size is normal. The right ventricular systolic function is normal.  ? Mildly dilated left atrium. Normal right atrium size.  ? No hemodynamically significant valvular pathology.  ? The estimated pulmonary artery systolic pressure is 39 mmHg. Normal central venous pressure.  ? No pericardial effusion. Prominent fat pad.     When compared to the study from 10/22/2020, PASP was estimated at 28 mmHg.  No significant interval change.    Most Recent Left Heart Catheterization: 10/23/2020  1. Severe 2-vessel coronary artery disease as described above.  2. Left main stenosis with significant lucency likely the culprit for the patient's non-ST-elevation myocardial infarction.  3. Patent LIMA to LAD.  4. Patent SVG to the OM 2.  5. Normal LVEDP.   6. Successful PCI of the left main extending into left circumflex artery utilizing  a single drug-eluting 3.0 x 15 mm Xience Skypoint stent (post dilated 3.5 mm).      CXR: Dense consolidation of the left lower lobe, likely representing pneumonia.  Small left pleural effusion.  Mild interstitial opacities.    ECG: 08/21/2021  Sinus rhythm.  Heart rate 70 bpm.  T wave inversions in the inferior and lateral precordial leads.    Telemetry: Sinus rhythm.    Review of Systems:  A complete 10 point review of systems was negative except for that which was outlined in HPI.     Cardiopulmonary ROS as outlined above.    Pertinent negatives include:   General--Fever, chills, recent illness, fatigue, weakness, night sweats, weight loss, weight gain, anorexia  HEENT--HA, tremors, visual changes, photophobia, mouth sores, tooth pain, sore throat, dysphagia   GI--Abdominal pain, N/V/D/C, melena, hematochezia, hematemesis  GU--Dysuria, hematuria, painful urination, burning with urination  MSK--Back pain, muscle pain, joint pain  Skin--Skin rash, skin wounds  Endo--Heat intolerance, cold intolerance, polyuria, polydipsia, polyphagia  Heme--Easy bleeding, easy bruising  Neuro--Seizure, LOC  Psych--SI/HI, mood change, depression, anxiety, auditory hallucinations, visual hallucinations    Past Medical History:  Medical History:   Diagnosis Date   ? Acute saddle pulmonary embolism with acute cor pulmonale (HCC)    ? CAD (coronary artery disease) 07/04/2008   ? CAD (coronary artery disease), native coronary artery 07/04/2008    1999: Complex stent PCI to proximal OMB  03/07/12: EF 60%, 20% ostial left main, 40% prox LAD, 70% mid, 40% distal LAD at previous POBA site; Dominant CFX - 20% prox-CFX, 40% mid-CFX, 30% prox 1st OMB,  50% instent restenosis in 2nd OMB, 40-50% distal 2nd OMB, 40-50% left PDA, small diffusely disease RCA without significant obstruction, per cath by Dr. Vivianne Spence.  No PCI indicated.    ? Carotid artery disease (HCC) 07/04/2008   ? Essential hypertension 07/04/2008   ? H/O two vessel coronary artery bypass graft    ? HLD (hyperlipidemia) 07/04/2008   ? HTN (hypertension) 07/04/2008   ? Hypothyroidism 09/30/2017   ? PVD (peripheral vascular disease) (HCC) 07/04/2008   ? Type 2 diabetes mellitus (HCC)        Past Surgical History:  Surgical History:   Procedure Laterality Date   ? HX CORONARY STENT PLACEMENT  1999   ? TOTAL ABDOMINAL HYSTERECTOMY  08/2009   ? Left Heart Catheterization With Ventriculogram Left 10/15/2016    Performed by Nat Math, MD at [facility] CATH LAB   ? Coronary Angiography N/A 10/15/2016    Performed by Nat Math, MD at [facility] CATH LAB   ? Possible Percutaneous Coronary Intervention N/A 10/15/2016    Performed by Nat Math, MD at [facility] CATH LAB   ? BYPASS GRAFT CORONARY ARTERY; LEFT INTERNAL MAMMARY ARTERY HARVEST; RIGHT ENDOSCOPIC SAPHENOUS VEIN HARVEST N/A 10/16/2016    Performed by Collene Schlichter, MD at [facility] CVOR   ? ANGIOGRAPHY CORONARY ARTERY WITH LEFT HEART CATHETERIZATION Left 10/23/2020    Performed by Marcell Barlow, MD at [facility] EP LAB   ? POSSIBLE PERCUTANEOUS CORONARY STENT PLACEMENT WITH ANGIOPLASTY N/A 10/23/2020    Performed by Marcell Barlow, MD at [facility] EP LAB   ? CAROTID ENDARDECTOMY      LEFT   ? ILIO-FEMORAL BYPASS GRAFT      BILATERAL STENTS       Family History:  Family History   Problem Relation Age of Onset   ? Coronary Artery Disease Mother    ?  Cancer Father        Social History:  Social History     Socioeconomic History   ? Marital status: Widowed   Tobacco Use   ? Smoking status: Former Smoker     Packs/day: 1.00     Years: 20.00     Pack years: 20.00     Types: Cigarettes     Quit date: 12/28/1988     Years since quitting: 32.6   ? Smokeless tobacco: Never Used   ? Tobacco comment: Pt quit 20 years ago.   Vaping Use   ? Vaping Use: Never used   Substance and Sexual Activity   ? Alcohol use: No   ? Drug use: No   Social History Narrative    Retired Futures trader. Lives in [place] with husband and daughter.       Allergies:   Iodinated contrast media, Atorvastatin, Imdur [isosorbide mononitrate], and Lisinopril    Vital signs reviewed on 08/26/2021.    CURRENT WEIGHT: @WTENG @   BMI: @BMI1 @   PREVIOUS WEIGHTS:   Wt Readings from Last 25 Encounters:   08/26/21 83 kg (182 lb 15.7 oz)   12/26/20 74.8 kg (165 lb)   10/29/20 79.4 kg (175 lb)   01/12/19 78.6 kg (173 lb 3.2 oz)   03/29/18 80.3 kg (177 lb)   12/14/17 78.7 kg (173 lb 6.4 oz)   12/08/17 79.4 kg (175 lb)   10/12/17 79.8 kg (176 lb)   10/04/17 80.2 kg (176 lb 12.8 oz)   09/28/17 80.3 kg (177 lb)   08/10/17 79.8 kg (176 lb)   05/11/17 79.4 kg (175 lb)   12/15/16 77.7 kg (171 lb 6.4 oz)   11/25/16 77.1 kg (170 lb)   10/28/16 82.4 kg (181 lb 9.6 oz)   09/22/16 80.4 kg (177 lb 3.2 oz)   08/27/16 81.6 kg (180 lb)   04/09/16 84 kg (185 lb 3.2 oz)   10/03/15 79.7 kg (175 lb 9.6 oz)   11/29/14 82.7 kg (182 lb 6.4 oz)   03/23/13 81.6 kg (180 lb)   02/02/13 81.6 kg (180 lb)   10/11/12 81.6 kg (180 lb)   03/31/12 83.5 kg (184 lb)   10/07/11 83 kg (183 lb)        Intake/Output Summary:  (Last 24 hours)  -960 mL    Physical Exam:     General Appearance: No acute distress. Fully alert and oriented.  Skin: Warm. No ulcers or xanthomas.   HEENT: Grossly unremarkable. Lips and oral mucosa without pallor or cyanosis. Moist mucous membranes.   Neck Veins: Normal jugular venous pressure. Neck veins are not distended.  Carotid Arteries: Normal carotid upstroke bilaterally. No bruits.  Chest Inspection: Midline scar consistent with prior sternotomy.  Auscultation/Percussion: Decreased breath sounds in the left lower lobe.  No wheezes, rales, rhonchi.  Cardiac Rhythm: Regular rhythm. Normal rate.  Cardiac Auscultation: Normal S1 & S2. No S3 or S4. No rub.  Murmurs: No cardiac murmurs.  Peripheral Circulation: Normal peripheral circulation.   Abdominal Aorta: No abdominal aortic bruit.  Extremities: Appropriately warm to touch. No lower extremity edema.  Abdominal Exam: Soft, non-tender. No masses, no organomegaly. Normal bowel sounds.  Neurologic Exam: Neurological assessment grossly intact.    Lab:  Labs reviewed on 08/26/2021.  Pertinent labs reviewed.    Radiology and Other Diagnostic Procedures Review:    Pertinent radiology reviewed.

## 2021-09-02 ENCOUNTER — Encounter: Admit: 2021-09-02 | Discharge: 2021-09-02 | Payer: MEDICARE

## 2021-09-02 DIAGNOSIS — I779 Disorder of arteries and arterioles, unspecified: Secondary | ICD-10-CM

## 2021-09-02 DIAGNOSIS — I739 Peripheral vascular disease, unspecified: Secondary | ICD-10-CM

## 2021-09-02 DIAGNOSIS — Z951 Presence of aortocoronary bypass graft: Secondary | ICD-10-CM

## 2021-09-02 DIAGNOSIS — E039 Hypothyroidism, unspecified: Secondary | ICD-10-CM

## 2021-09-02 DIAGNOSIS — I2510 Atherosclerotic heart disease of native coronary artery without angina pectoris: Secondary | ICD-10-CM

## 2021-09-02 DIAGNOSIS — E119 Type 2 diabetes mellitus without complications: Secondary | ICD-10-CM

## 2021-09-02 DIAGNOSIS — I2602 Saddle embolus of pulmonary artery with acute cor pulmonale: Secondary | ICD-10-CM

## 2021-09-02 DIAGNOSIS — Z136 Encounter for screening for cardiovascular disorders: Secondary | ICD-10-CM

## 2021-09-02 DIAGNOSIS — E785 Hyperlipidemia, unspecified: Secondary | ICD-10-CM

## 2021-09-02 DIAGNOSIS — I10 Essential (primary) hypertension: Secondary | ICD-10-CM

## 2021-12-09 ENCOUNTER — Encounter: Admit: 2021-12-09 | Discharge: 2021-12-09 | Payer: MEDICARE

## 2022-07-17 ENCOUNTER — Encounter: Admit: 2022-07-17 | Discharge: 2022-07-17 | Payer: MEDICARE

## 2022-07-23 ENCOUNTER — Encounter: Admit: 2022-07-23 | Discharge: 2022-07-23 | Payer: MEDICARE

## 2022-07-23 DIAGNOSIS — E119 Type 2 diabetes mellitus without complications: Secondary | ICD-10-CM

## 2022-07-23 DIAGNOSIS — E039 Hypothyroidism, unspecified: Secondary | ICD-10-CM

## 2022-07-23 DIAGNOSIS — I739 Peripheral vascular disease, unspecified: Secondary | ICD-10-CM

## 2022-07-23 DIAGNOSIS — I2510 Atherosclerotic heart disease of native coronary artery without angina pectoris: Secondary | ICD-10-CM

## 2022-07-23 DIAGNOSIS — E785 Hyperlipidemia, unspecified: Secondary | ICD-10-CM

## 2022-07-23 DIAGNOSIS — I2602 Saddle embolus of pulmonary artery with acute cor pulmonale: Secondary | ICD-10-CM

## 2022-07-23 DIAGNOSIS — I10 Essential (primary) hypertension: Secondary | ICD-10-CM

## 2022-07-23 DIAGNOSIS — Z951 Presence of aortocoronary bypass graft: Secondary | ICD-10-CM

## 2022-07-23 DIAGNOSIS — I779 Disorder of arteries and arterioles, unspecified: Secondary | ICD-10-CM

## 2022-07-23 MED ORDER — FUROSEMIDE 20 MG PO TAB
20 mg | ORAL_TABLET | ORAL | 3 refills | 90.00000 days | Status: AC | PRN
Start: 2022-07-23 — End: ?

## 2022-07-23 NOTE — Patient Instructions
Thank you for visiting our office today.    We would like to make the following medication adjustments:      Lasix 1-2 tablets as needed for swelling       Otherwise continue the same medications as you have been doing.          We will be pursuing the following tests after your appointment today:       Orders Placed This Encounter    furosemide (LASIX) 20 mg tablet         We will plan to see you back in 6 months.  Please call us in the meantime with any questions or concerns.        Please allow 5-7 business days for our providers to review your results. All normal results will go to MyChart. If you do not have Mychart, it is strongly recommended to get this so you can easily view all your results. If you do not have mychart, we will attempt to call you once with normal lab and testing results. If we cannot reach you by phone with normal results, we will send you a letter.  If you have not heard the results of your testing after one week please give [place] a call.       Your Cardiovascular Medicine [place] [place] [facility] Team Brett Canales, Pilar Jarvis, Shawna Orleans, and [place])  phone number is [phone].

## 2022-08-20 ENCOUNTER — Encounter: Admit: 2022-08-20 | Discharge: 2022-08-20 | Payer: MEDICARE

## 2022-08-20 MED ORDER — FUROSEMIDE 20 MG PO TAB
ORAL_TABLET | ORAL | 3 refills | 90.00000 days | Status: AC
Start: 2022-08-20 — End: ?

## 2022-11-05 ENCOUNTER — Encounter: Admit: 2022-11-05 | Discharge: 2022-11-05 | Payer: MEDICARE

## 2023-08-06 ENCOUNTER — Encounter: Admit: 2023-08-06 | Discharge: 2023-08-06 | Payer: MEDICARE

## 2023-08-06 MED ORDER — FUROSEMIDE 20 MG PO TAB
ORAL_TABLET | ORAL | 3 refills | 90.00000 days | Status: AC
Start: 2023-08-06 — End: ?

## 2023-08-06 NOTE — Progress Notes
Patient overdue for office visit. Last saw Dr. Geronimo Boot in [place] on 07/23/22 and was suppose to return in 6 months around January 2024.

## 2023-09-28 DEATH — deceased
